# Patient Record
Sex: Female | Born: 1980 | Hispanic: No | Marital: Married | Smoking: Never smoker
Health system: Southern US, Community
[De-identification: ages and names within clinical notes are randomized; demographics above are authoritative.]

## PROBLEM LIST (undated history)

## (undated) DIAGNOSIS — F5001 Anorexia nervosa, restricting type: Secondary | ICD-10-CM

## (undated) DIAGNOSIS — F50019 Anorexia nervosa, restricting type, unspecified: Secondary | ICD-10-CM

## (undated) HISTORY — PX: ADENOIDECTOMY: SUR15

---

## 2001-01-02 ENCOUNTER — Other Ambulatory Visit: Admission: RE | Admit: 2001-01-02 | Discharge: 2001-01-02 | Payer: Self-pay | Admitting: Obstetrics and Gynecology

## 2004-04-01 ENCOUNTER — Other Ambulatory Visit: Admission: RE | Admit: 2004-04-01 | Discharge: 2004-04-01 | Payer: Self-pay | Admitting: Obstetrics and Gynecology

## 2008-03-19 ENCOUNTER — Encounter: Admission: RE | Admit: 2008-03-19 | Discharge: 2008-03-19 | Payer: Self-pay | Admitting: Obstetrics and Gynecology

## 2008-04-26 ENCOUNTER — Inpatient Hospital Stay (HOSPITAL_COMMUNITY): Admission: AD | Admit: 2008-04-26 | Discharge: 2008-04-27 | Payer: Self-pay | Admitting: Obstetrics and Gynecology

## 2008-04-29 ENCOUNTER — Encounter: Admission: RE | Admit: 2008-04-29 | Discharge: 2008-05-26 | Payer: Self-pay | Admitting: Obstetrics and Gynecology

## 2010-06-30 LAB — CBC
HCT: 24.8 % — ABNORMAL LOW (ref 36.0–46.0)
HCT: 34 % — ABNORMAL LOW (ref 36.0–46.0)
Hemoglobin: 11.4 g/dL — ABNORMAL LOW (ref 12.0–15.0)
Hemoglobin: 8.4 g/dL — ABNORMAL LOW (ref 12.0–15.0)
MCHC: 33.4 g/dL (ref 30.0–36.0)
MCHC: 34 g/dL (ref 30.0–36.0)
MCV: 91.3 fL (ref 78.0–100.0)
MCV: 92.7 fL (ref 78.0–100.0)
Platelets: 157 10*3/uL (ref 150–400)
Platelets: 178 10*3/uL (ref 150–400)
RBC: 2.67 MIL/uL — ABNORMAL LOW (ref 3.87–5.11)
RBC: 3.72 MIL/uL — ABNORMAL LOW (ref 3.87–5.11)
RDW: 12.9 % (ref 11.5–15.5)
RDW: 13 % (ref 11.5–15.5)
WBC: 14.6 10*3/uL — ABNORMAL HIGH (ref 4.0–10.5)
WBC: 9.4 10*3/uL (ref 4.0–10.5)

## 2010-06-30 LAB — GLUCOSE, CAPILLARY: Glucose-Capillary: 73 mg/dL (ref 70–99)

## 2010-06-30 LAB — RPR: RPR Ser Ql: NONREACTIVE

## 2010-07-28 NOTE — Discharge Summary (Signed)
[name], [name]               ACCOUNT NO.:  [id number]   MEDICAL RECORD NO.:  [id number]          PATIENT TYPE:  INP   LOCATION:  9132                          FACILITY:  [facility]   PHYSICIAN:  Malachi Pro. Ambrose Mantle, M.D. DATE OF BIRTH:  November 23, 1980   DATE OF ADMISSION:  04/26/2008  DATE OF DISCHARGE:  04/27/2008                               DISCHARGE SUMMARY   A 30 year old white female, para 0 gravida 1.  Estimated gestational age  [redacted] weeks by last period compatible with an 8-week ultrasound with EDC,  May 17, 2008, presented complaining of leaking fluid since 11:30 p.m.  on April 25, 2008.  She had had some contraction.  She was evaluated  in maternity admission.  Rupture of membranes was confirmed, cervix was  2 cm.  The patient was admitted and declined Pitocin and she walked for  a while.  Blood group and type O+ with a negative antibody, RPR  nonreactive, hepatitis B surface antigen negative.  Rubella equivocal.  HIV negative.  GC and Chlamydia negative.  One-hour Glucola was 149.  Three-hour GTT 78, 179, 15, and 141.  Group B strep was negative.  Prenatal care was complicated by shingles at 20 weeks.  Gestational  diabetes mellitus was controlled with diet.   PAST MEDICAL HISTORY:  No known drug allergies.  No history of  illnesses.  She had had her adenoids removed.  She was on no medication.   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  She conceived with  donor sperm.   PHYSICAL EXAMINATION:  On admission, she was afebrile with normal vital  signs.  Heart and lungs were normal.  The abdomen was soft, nontender.  Estimated fetal weight was felt to be about 7 pounds.  Fetal heart tones  were reassuring with moderate variability, some shallow variable  decelerations.   ADMITTING IMPRESSION:  Intrauterine pregnancy at 37 weeks with premature  rupture of the membranes.  Cervix was 3 cm, 80%.  The patient had  declined Pitocin.  Dr. Leighton Roach Meisinger  discussed the  risks of  infection with delaying delivery, and she agreed to start Pitocin.  The  patient then became uncomfortable with contractions.  She was working  with her birthing ball.  Cervix by 11:10 a.m. was 4 cm, 90%.  The  patient progressed to complete dilatation, pushed for 10 minutes, and  delivered a living female infant at 3:28 p.m. by Dr. Sherron Monday, 6  pounds 5 ounces infant.  Apgars of 9 at one and 9 at five minutes.  Placenta was expressed intact.  Second-degree perineal and peri-clitoral  lacerations, repaired with 3-0 Vicryl.  Blood loss about 500 mL.  Postpartum, the patient did well and requested discharge on the first  postpartum day.  RPR was nonreactive.  Capillary blood glucose was 73.  Initial hemoglobin 11.4, hematocrit 34, white count 9400, platelet count  178,000.  Followup hemoglobin 8.4, hematocrit 24.8.   FINAL DIAGNOSES:  Intrauterine pregnancy at 37 weeks with premature  rupture of membranes, delivered vertex.   OPERATION:  Spontaneous delivery vertex, repair of lacerations.  FINAL CONDITION:  Improved.  Instructions include the regular discharge  instruction booklet.  She declines analgesics but is advised to take  ferrous sulfate 325 mg twice daily along with her prenatal vitamin.      Malachi Pro. Ambrose Mantle, M.D.  Electronically Signed     TFH/MEDQ  D:  04/27/2008  T:  04/27/2008  Job:  [id number]

## 2019-02-26 ENCOUNTER — Telehealth: Payer: Self-pay | Admitting: *Deleted

## 2019-02-26 NOTE — Telephone Encounter (Signed)
Erroneous encounter.   Encounter closed.  

## 2019-05-10 ENCOUNTER — Ambulatory Visit: Payer: Self-pay

## 2020-07-18 ENCOUNTER — Emergency Department (HOSPITAL_BASED_OUTPATIENT_CLINIC_OR_DEPARTMENT_OTHER)
Admission: EM | Admit: 2020-07-18 | Discharge: 2020-07-18 | Disposition: A | Discharge status: Home / Self Care | Payer: BC Managed Care – PPO | Attending: Emergency Medicine | Admitting: Emergency Medicine

## 2020-07-18 ENCOUNTER — Other Ambulatory Visit: Payer: Self-pay

## 2020-07-18 ENCOUNTER — Encounter (HOSPITAL_BASED_OUTPATIENT_CLINIC_OR_DEPARTMENT_OTHER): Payer: Self-pay | Admitting: Obstetrics and Gynecology

## 2020-07-18 DIAGNOSIS — R5383 Other fatigue: Secondary | ICD-10-CM | POA: Diagnosis not present

## 2020-07-18 DIAGNOSIS — E860 Dehydration: Secondary | ICD-10-CM | POA: Diagnosis not present

## 2020-07-18 DIAGNOSIS — R531 Weakness: Secondary | ICD-10-CM | POA: Diagnosis not present

## 2020-07-18 DIAGNOSIS — R630 Anorexia: Secondary | ICD-10-CM | POA: Insufficient documentation

## 2020-07-18 DIAGNOSIS — F419 Anxiety disorder, unspecified: Secondary | ICD-10-CM | POA: Insufficient documentation

## 2020-07-18 DIAGNOSIS — R110 Nausea: Secondary | ICD-10-CM | POA: Diagnosis not present

## 2020-07-18 HISTORY — DX: Anorexia nervosa, restricting type, unspecified: F50.019

## 2020-07-18 HISTORY — DX: Anorexia nervosa, restricting type: F50.01

## 2020-07-18 LAB — URINALYSIS, ROUTINE W REFLEX MICROSCOPIC
Bilirubin Urine: NEGATIVE
Glucose, UA: NEGATIVE mg/dL
Hgb urine dipstick: NEGATIVE
Ketones, ur: >80 mg/dL — AB
Leukocytes,Ua: NEGATIVE
Nitrite: NEGATIVE
Protein, ur: NEGATIVE mg/dL
Specific Gravity, Urine: 1.015 (ref 1.005–1.030)
pH: 5 (ref 5.0–8.0)

## 2020-07-18 LAB — COMPREHENSIVE METABOLIC PANEL
ALT: 14 U/L (ref 0–44)
AST: 22 U/L (ref 15–41)
Albumin: 4.9 g/dL (ref 3.5–5.0)
Alkaline Phosphatase: 49 U/L (ref 38–126)
Anion gap: 15 (ref 5–15)
BUN: 8 mg/dL (ref 6–20)
CO2: 20 mmol/L — ABNORMAL LOW (ref 22–32)
Calcium: 9.4 mg/dL (ref 8.9–10.3)
Chloride: 99 mmol/L (ref 98–111)
Creatinine, Ser: 0.73 mg/dL (ref 0.44–1.00)
GFR, Estimated: >60 mL/min (ref >60)
Glucose, Bld: 143 mg/dL — ABNORMAL HIGH (ref 70–99)
Potassium: 4.3 mmol/L (ref 3.5–5.1)
Sodium: 134 mmol/L — ABNORMAL LOW (ref 135–145)
Total Bilirubin: 0.6 mg/dL (ref 0.3–1.2)
Total Protein: 8 g/dL (ref 6.5–8.1)

## 2020-07-18 LAB — CBC WITH DIFFERENTIAL/PLATELET
Abs Immature Granulocytes: 0.01 10*3/uL (ref 0.00–0.07)
Basophils Absolute: 0.1 10*3/uL (ref 0.0–0.1)
Basophils Relative: 1 %
Eosinophils Absolute: 0.1 10*3/uL (ref 0.0–0.5)
Eosinophils Relative: 1 %
HCT: 40.6 % (ref 36.0–46.0)
Hemoglobin: 13.7 g/dL (ref 12.0–15.0)
Immature Granulocytes: 0 %
Lymphocytes Relative: 34 %
Lymphs Abs: 2.1 10*3/uL (ref 0.7–4.0)
MCH: 30.9 pg (ref 26.0–34.0)
MCHC: 33.7 g/dL (ref 30.0–36.0)
MCV: 91.4 fL (ref 80.0–100.0)
Monocytes Absolute: 0.8 10*3/uL (ref 0.1–1.0)
Monocytes Relative: 12 %
Neutro Abs: 3.3 10*3/uL (ref 1.7–7.7)
Neutrophils Relative %: 52 %
Platelets: 225 10*3/uL (ref 150–400)
RBC: 4.44 MIL/uL (ref 3.87–5.11)
RDW: 12.2 % (ref 11.5–15.5)
WBC: 6.3 10*3/uL (ref 4.0–10.5)
nRBC: 0 % (ref 0.0–0.2)

## 2020-07-18 MED ORDER — SODIUM CHLORIDE 0.9 % IV BOLUS
1000.0000 mL | Freq: Once | INTRAVENOUS | Status: AC
  Administered 2020-07-18: 1000 mL via INTRAVENOUS

## 2020-07-18 NOTE — BH Assessment (Signed)
Received TTS consult order. All TTS counselors are currently with walk-in patients at [facility] and [facility]. There will be a delay in Pt being assessed.   Pamalee Leyden, [facility], [facility] Triage Specialist [phone]

## 2020-07-18 NOTE — ED Provider Notes (Signed)
[facility] EMERGENCY DEPT Provider Note   CSN: [id number] Arrival date & time: 07/18/20  1739     History Chief Complaint  Patient presents with  . Anorexia    [name] is a 40 y.o. female.  Patient is a 40 year old female who presents with lightheadedness and weakness from anorexia.  She says she had a eating disorder issue in 2020 and 21.  She says it got better but over the last week she has had some increased stressors at work and its flared up again.  She says she has not eaten much all week.  She has not consumed any food in the last 3 days.  She says she has been trying to hydrate but she has had some nausea so has she has not had much to drink today.  She is followed by a group that treats her eating disorder.  She was supposed to see a therapist and a dietitian today but the dietitian was concerned about her lack of eating and sent her here for evaluation.  She has had some lightheadedness and general fatigue.  She does not report any chest pain or shortness of breath but does have some palpitations.  No diarrhea.  She has had some intermittent dry heaves.  No fevers, cough, congestion or other recent illnesses.  She denies any abdominal pain.  She has some anxiety and some mild depression but she denies any suicidal ideations.          Past Medical History:  Diagnosis Date  . Anorexia nervosa, restricting type     There are no problems to display for this patient.   Past Surgical History:  Procedure Laterality Date  . ADENOIDECTOMY       OB History    Gravida      Para      Term      Preterm      AB      Living  1     SAB      IAB      Ectopic      Multiple      Live Births              No family history on file.  Social History   Tobacco Use  . Smoking status: Never Smoker  . Smokeless tobacco: Never Used  Vaping Use  . Vaping Use: Never used  Substance Use Topics  . Alcohol use: Yes    Comment: Social  .  Drug use: Not Currently    Home Medications Prior to Admission medications   Not on File    Allergies    Sulfa antibiotics  Review of Systems   Review of Systems  Constitutional: Positive for fatigue. Negative for chills, diaphoresis and fever.  HENT: Negative for congestion, rhinorrhea and sneezing.   Eyes: Negative.   Respiratory: Negative for cough, chest tightness and shortness of breath.   Cardiovascular: Positive for palpitations. Negative for chest pain and leg swelling.  Gastrointestinal: Positive for nausea. Negative for abdominal pain, blood in stool, diarrhea and vomiting.  Genitourinary: Negative for difficulty urinating, flank pain, frequency and hematuria.  Musculoskeletal: Negative for arthralgias and back pain.  Skin: Negative for rash.  Neurological: Positive for light-headedness. Negative for dizziness, speech difficulty, weakness, numbness and headaches.  Psychiatric/Behavioral: Positive for dysphoric mood. Negative for suicidal ideas. The patient is nervous/anxious.     Physical Exam Updated Vital Signs BP 127/78 (BP Location: Right Arm)   Pulse 96  Temp 98.4 F (36.9 C) (Oral)   Resp 16   Ht 5\' 5"  (1.651 m)   Wt 62.2 kg   LMP 07/06/2020 (Approximate)   SpO2 99%   BMI 22.83 kg/m   Physical Exam Constitutional:      Appearance: She is well-developed.  HENT:     Head: Normocephalic and atraumatic.  Eyes:     Pupils: Pupils are equal, round, and reactive to light.  Cardiovascular:     Rate and Rhythm: Normal rate and regular rhythm.     Heart sounds: Normal heart sounds.  Pulmonary:     Effort: Pulmonary effort is normal. No respiratory distress.     Breath sounds: Normal breath sounds. No wheezing or rales.  Chest:     Chest wall: No tenderness.  Abdominal:     General: Bowel sounds are normal.     Palpations: Abdomen is soft.     Tenderness: There is no abdominal tenderness. There is no guarding or rebound.  Musculoskeletal:         General: Normal range of motion.     Cervical back: Normal range of motion and neck supple.  Lymphadenopathy:     Cervical: No cervical adenopathy.  Skin:    General: Skin is warm and dry.     Findings: No rash.  Neurological:     Mental Status: She is alert and oriented to person, place, and time.     ED Results / Procedures / Treatments   Labs (all labs ordered are listed, but only abnormal results are displayed) Labs Reviewed  COMPREHENSIVE METABOLIC PANEL - Abnormal; Notable for the following components:      Result Value   Sodium 134 (*)    CO2 20 (*)    Glucose, Bld 143 (*)    All other components within normal limits  URINALYSIS, ROUTINE W REFLEX MICROSCOPIC - Abnormal; Notable for the following components:   Ketones, ur >80 (*)    All other components within normal limits  CBC WITH DIFFERENTIAL/PLATELET    EKG None  Radiology No results found.  Procedures Procedures   Medications Ordered in ED Medications  sodium chloride 0.9 % bolus 1,000 mL (0 mLs Intravenous Stopped 07/18/20 1921)    ED Course  I have reviewed the triage vital signs and the nursing notes.  Pertinent labs & imaging results that were available during my care of the patient were reviewed by me and considered in my medical decision making (see chart for details).    MDM Rules/Calculators/A&P                          Patient presents with concerns for dehydration and malnutrition from not eating for the last few days.  She does have some anxiety but denies any thoughts of hurting herself.  Her labs show mildly elevated glucose.  She has some ketones in her urine.  Otherwise her electrolytes look okay and her renal function is normal.  She was given IV fluids.  She was able to drink Shasta cola and eat some crackers without difficulty.  I did order TTS consult.  However patient waited for couple hours and then did not want to wait any longer.  She does not meet criteria for IVC.  She does say  that she will try to stay hydrated and eat at home.  She is texting her dietitian when she leaves here and says that she can follow-up closely with her therapist.  Return precautions were given. Final Clinical Impression(s) / ED Diagnoses Final diagnoses:  Dehydration  Anorexia    Rx / DC Orders ED Discharge Orders    None       Rolan Bucco, MD 07/18/20 2134

## 2020-07-18 NOTE — ED Triage Notes (Signed)
Patient reports to the ER for anorexia with possible dehydration/malnutrition. Patient was sent by her dietician after an office visit with a reported 5% weight loss.

## 2020-07-18 NOTE — ED Notes (Signed)
Patient given crackers and lemon lime soda at this time.

## 2020-07-18 NOTE — ED Notes (Signed)
Called Ford at [facility] to advise of TTS

## 2020-07-26 ENCOUNTER — Observation Stay (HOSPITAL_COMMUNITY): Payer: BC Managed Care – PPO

## 2020-07-26 ENCOUNTER — Inpatient Hospital Stay (HOSPITAL_COMMUNITY)
Admission: AD | Admit: 2020-07-26 | Discharge: 2020-07-29 | DRG: 640 | Disposition: A | Discharge status: Home / Self Care | Payer: BC Managed Care – PPO | Source: Ambulatory Visit | Attending: Internal Medicine | Admitting: Internal Medicine

## 2020-07-26 ENCOUNTER — Other Ambulatory Visit: Payer: Self-pay

## 2020-07-26 ENCOUNTER — Encounter (HOSPITAL_COMMUNITY): Payer: Self-pay | Admitting: Internal Medicine

## 2020-07-26 DIAGNOSIS — R002 Palpitations: Secondary | ICD-10-CM | POA: Diagnosis not present

## 2020-07-26 DIAGNOSIS — F5000 Anorexia nervosa, unspecified: Secondary | ICD-10-CM

## 2020-07-26 DIAGNOSIS — Z882 Allergy status to sulfonamides status: Secondary | ICD-10-CM

## 2020-07-26 DIAGNOSIS — F419 Anxiety disorder, unspecified: Secondary | ICD-10-CM | POA: Diagnosis present

## 2020-07-26 DIAGNOSIS — E8729 Other acidosis: Secondary | ICD-10-CM | POA: Diagnosis present

## 2020-07-26 DIAGNOSIS — E860 Dehydration: Principal | ICD-10-CM | POA: Diagnosis present

## 2020-07-26 DIAGNOSIS — Z20822 Contact with and (suspected) exposure to covid-19: Secondary | ICD-10-CM | POA: Diagnosis present

## 2020-07-26 DIAGNOSIS — R000 Tachycardia, unspecified: Secondary | ICD-10-CM | POA: Diagnosis not present

## 2020-07-26 DIAGNOSIS — R630 Anorexia: Secondary | ICD-10-CM

## 2020-07-26 DIAGNOSIS — E161 Other hypoglycemia: Secondary | ICD-10-CM | POA: Diagnosis present

## 2020-07-26 DIAGNOSIS — F50019 Anorexia nervosa, restricting type, unspecified: Secondary | ICD-10-CM

## 2020-07-26 DIAGNOSIS — Z6821 Body mass index (BMI) 21.0-21.9, adult: Secondary | ICD-10-CM

## 2020-07-26 DIAGNOSIS — F5001 Anorexia nervosa, restricting type: Secondary | ICD-10-CM | POA: Diagnosis present

## 2020-07-26 DIAGNOSIS — Z809 Family history of malignant neoplasm, unspecified: Secondary | ICD-10-CM

## 2020-07-26 DIAGNOSIS — E43 Unspecified severe protein-calorie malnutrition: Secondary | ICD-10-CM | POA: Insufficient documentation

## 2020-07-26 DIAGNOSIS — E872 Acidosis: Secondary | ICD-10-CM | POA: Diagnosis present

## 2020-07-26 DIAGNOSIS — E876 Hypokalemia: Secondary | ICD-10-CM | POA: Diagnosis present

## 2020-07-26 DIAGNOSIS — Z8659 Personal history of other mental and behavioral disorders: Secondary | ICD-10-CM

## 2020-07-26 DIAGNOSIS — T730XXA Starvation, initial encounter: Secondary | ICD-10-CM | POA: Diagnosis present

## 2020-07-26 LAB — CBC WITH DIFFERENTIAL/PLATELET
Abs Immature Granulocytes: 0.02 10*3/uL (ref 0.00–0.07)
Basophils Absolute: 0.1 10*3/uL (ref 0.0–0.1)
Basophils Relative: 1 %
Eosinophils Absolute: 0.1 10*3/uL (ref 0.0–0.5)
Eosinophils Relative: 2 %
HCT: 43.9 % (ref 36.0–46.0)
Hemoglobin: 15 g/dL (ref 12.0–15.0)
Immature Granulocytes: 0 %
Lymphocytes Relative: 35 %
Lymphs Abs: 2.6 10*3/uL (ref 0.7–4.0)
MCH: 31.6 pg (ref 26.0–34.0)
MCHC: 34.2 g/dL (ref 30.0–36.0)
MCV: 92.6 fL (ref 80.0–100.0)
Monocytes Absolute: 0.8 10*3/uL (ref 0.1–1.0)
Monocytes Relative: 10 %
Neutro Abs: 3.8 10*3/uL (ref 1.7–7.7)
Neutrophils Relative %: 52 %
Platelets: 189 10*3/uL (ref 150–400)
RBC: 4.74 MIL/uL (ref 3.87–5.11)
RDW: 12.3 % (ref 11.5–15.5)
WBC: 7.3 10*3/uL (ref 4.0–10.5)
nRBC: 0 % (ref 0.0–0.2)

## 2020-07-26 LAB — COMPREHENSIVE METABOLIC PANEL
ALT: 15 U/L (ref 0–44)
AST: 28 U/L (ref 15–41)
Albumin: 4.6 g/dL (ref 3.5–5.0)
Alkaline Phosphatase: 45 U/L (ref 38–126)
Anion gap: 16 — ABNORMAL HIGH (ref 5–15)
BUN: 15 mg/dL (ref 6–20)
CO2: 12 mmol/L — ABNORMAL LOW (ref 22–32)
Calcium: 9 mg/dL (ref 8.9–10.3)
Chloride: 109 mmol/L (ref 98–111)
Creatinine, Ser: 0.78 mg/dL (ref 0.44–1.00)
GFR, Estimated: >60 mL/min (ref >60)
Glucose, Bld: 61 mg/dL — ABNORMAL LOW (ref 70–99)
Potassium: 4.6 mmol/L (ref 3.5–5.1)
Sodium: 137 mmol/L (ref 135–145)
Total Bilirubin: 1.5 mg/dL — ABNORMAL HIGH (ref 0.3–1.2)
Total Protein: 7.9 g/dL (ref 6.5–8.1)

## 2020-07-26 LAB — GLUCOSE, CAPILLARY
Glucose-Capillary: 59 mg/dL — ABNORMAL LOW (ref 70–99)
Glucose-Capillary: 106 mg/dL — ABNORMAL HIGH (ref 70–99)
Glucose-Capillary: 142 mg/dL — ABNORMAL HIGH (ref 70–99)

## 2020-07-26 LAB — PHOSPHORUS: Phosphorus: 3.2 mg/dL (ref 2.5–4.6)

## 2020-07-26 LAB — URINALYSIS, ROUTINE W REFLEX MICROSCOPIC
Bacteria, UA: NONE SEEN
Bilirubin Urine: NEGATIVE
Glucose, UA: NEGATIVE mg/dL
Hgb urine dipstick: NEGATIVE
Ketones, ur: 80 mg/dL — AB
Leukocytes,Ua: NEGATIVE
Nitrite: NEGATIVE
Protein, ur: 100 mg/dL — AB
Specific Gravity, Urine: 1.027 (ref 1.005–1.030)
pH: 6 (ref 5.0–8.0)

## 2020-07-26 LAB — SARS CORONAVIRUS 2 (TAT 6-24 HRS): SARS Coronavirus 2: NEGATIVE

## 2020-07-26 LAB — TSH: TSH: 0.425 u[IU]/mL (ref 0.350–4.500)

## 2020-07-26 LAB — MAGNESIUM: Magnesium: 2 mg/dL (ref 1.7–2.4)

## 2020-07-26 LAB — CORTISOL: Cortisol, Plasma: 13.4 ug/dL

## 2020-07-26 LAB — PROTIME-INR
INR: 1.1 (ref 0.8–1.2)
Prothrombin Time: 13.9 seconds (ref 11.4–15.2)

## 2020-07-26 LAB — LACTIC ACID, PLASMA: Lactic Acid, Venous: 1 mmol/L (ref 0.5–1.9)

## 2020-07-26 MED ORDER — ACETAMINOPHEN 325 MG PO TABS: 650.0000 mg | ORAL_TABLET | Freq: Four times a day (QID) | ORAL | Status: DC | PRN

## 2020-07-26 MED ORDER — ESCITALOPRAM OXALATE 10 MG PO TABS
20.0000 mg | ORAL_TABLET | Freq: Every day | ORAL | Status: DC
  Administered 2020-07-26 – 2020-07-28 (×3): 20 mg via ORAL
  Filled 2020-07-26 (×3): qty 2

## 2020-07-26 MED ORDER — ONDANSETRON HCL 4 MG/2ML IJ SOLN: 4.0000 mg | Freq: Four times a day (QID) | INTRAMUSCULAR | Status: DC | PRN

## 2020-07-26 MED ORDER — DEXTROSE-NACL 5-0.9 % IV SOLN: INTRAVENOUS | Status: DC

## 2020-07-26 MED ORDER — BUSPIRONE HCL 5 MG PO TABS: 5.0000 mg | ORAL_TABLET | Freq: Three times a day (TID) | ORAL | Status: DC | PRN

## 2020-07-26 MED ORDER — SODIUM CHLORIDE 0.9 % IV BOLUS
1000.0000 mL | Freq: Once | INTRAVENOUS | Status: AC
  Administered 2020-07-26: 1000 mL via INTRAVENOUS

## 2020-07-26 MED ORDER — ONDANSETRON HCL 4 MG PO TABS: 4.0000 mg | ORAL_TABLET | Freq: Four times a day (QID) | ORAL | Status: DC | PRN

## 2020-07-26 MED ORDER — ACETAMINOPHEN 650 MG RE SUPP: 650.0000 mg | Freq: Four times a day (QID) | RECTAL | Status: DC | PRN

## 2020-07-26 MED ORDER — DOCUSATE SODIUM 100 MG PO CAPS
100.0000 mg | ORAL_CAPSULE | Freq: Two times a day (BID) | ORAL | Status: DC
  Administered 2020-07-26 – 2020-07-29 (×5): 100 mg via ORAL
  Filled 2020-07-26 (×6): qty 1

## 2020-07-26 NOTE — Progress Notes (Signed)
Hypoglycemic Event  CBG: 59  Treatment:4 oz orange juice, Graham Crackers, Peanut butter Symptoms: None Follow-up CBG: Time:1738 CBG Result:106 Possible Reasons for Event: Last meal on 07/20/20 Comments/MD notified: Yes and  Received new orders   Sequoia Witz  Maryjean Ka

## 2020-07-26 NOTE — H&P (Addendum)
History and Physical    [name] DVV:[id number] DOB: 1980-09-11 DOA: 07/26/2020  PCP: Pcp, No Patient coming from: home  Chief Complaint: decreased oral intake and anorexia   HPI: [name] is a 40 y.o. female with medical history significant of anorexia nervosa who presented with decreased oral intake and anorexia.  Patient reports that she has had anorexia nervosa for almost " my whole life".  She lives at home with her 83 year old daughter and 2 cats.  She is followed by her PCP, dietitian and psychiatrist as outpatient.  Patient reports that she has been in the "downside of anorexia" over last 2 weeks.  She has not been able to eat and drink much.  Her last meal was an egg on Sunday 07/20/2020.  She did eat some vegetables on that day but she had induced vomiting afterwards.  She reports decreased fluid intake as well.  Associate symptoms include occasional palpitations feeling.  Patient was evaluated for dehydration at ED on 07/18/2020 and work-up showed no significant electrolyte abnormalities at that time. She She has labs draws at [facility] facilities yesterday on 07/25/20. The labs results could be found on care everywhere.  She had unremarkable CBC.  CMP showed sodium 141, chloride 104, potassium 4.3, BUN 13, creatinine 0.79, CO2 11, calcium 9.8, total protein 8.1, albumin 5.1, total bilirubin 0.6, ALP 60, AST 21, ALT 13, blood glucose 67, UA showed ketone and protein.  Patient states that she talked to her psychiatrist and family physician yesterday, and was instructed to come to the hospital. She is direct admit from home today.  Patient reports that she has had COVID-vaccine x3 shots.  Denies fevers, chills, cough, wheezing, shortness of breath, chest pain, chest pressure, diarrhea, abdominal pain, dysuria, urinary frequency or urgency.   Review of Systems: As per HPI otherwise 10 point review of systems negative.  Review of Systems Otherwise negative except as per HPI,  including: General: Denies fever, chills, night sweats or unintended weight loss. Resp: Denies cough, wheezing, shortness of breath. Cardiac: Denies chest pain, palpitations, orthopnea, paroxysmal nocturnal dyspnea. GI: Denies abdominal pain, nausea, vomiting, diarrhea or constipation.  Positive for decreased oral intake and anorexia. GU: Denies dysuria, frequency, hesitancy or incontinence MS: Denies muscle aches, joint pain or swelling Neuro: Denies headache, neurologic deficits (focal weakness, numbness, tingling), abnormal gait Psych: Denies anxiety, depression, SI/HI/AVH Skin: Denies new rashes or lesions ID: Denies sick contacts, exotic exposures, travel  Past Medical History:  Diagnosis Date  . Anorexia nervosa, restricting type     Past Surgical History:  Procedure Laterality Date  . ADENOIDECTOMY      SOCIAL HISTORY:  reports that she has never smoked. She has never used smokeless tobacco. She reports current alcohol use. She reports previous drug use.  Allergies  Allergen Reactions  . Sulfamethoxazole Hives  . Sulfa Antibiotics Hives    FAMILY HISTORY: Family History  Problem Relation Age of Onset  . Cancer Father      Prior to Admission medications   Medication Sig Start Date End Date Taking? Authorizing Provider  busPIRone (BUSPAR) 5 MG tablet Take 5 mg by mouth 3 (three) times daily as needed (for anxiety). 06/25/20  Yes [provider]  escitalopram (LEXAPRO) 20 MG tablet Take 20 mg by mouth at bedtime.   Yes [provider]  escitalopram (LEXAPRO) 10 MG tablet Take 10 mg by mouth daily. 03/07/20   [provider]    Physical Exam: Vitals:   07/26/20 1136  07/26/20 1355  BP: 108/75 116/72  Pulse: 96 90  Resp: 16 18  Temp: 99.5 F (37.5 C) 98.9 F (37.2 C)  TempSrc: Oral Oral  SpO2: 96% 100%  Weight: 57.4 kg   Height: 5\' 5"  (1.651 m)       Constitutional: NAD, calm, comfortable Eyes: PERRL, lids and conjunctivae  normal ENMT: Mucous membranes are moist. Posterior pharynx clear of any exudate or lesions.Normal dentition.  Neck: normal, supple, no masses, no thyromegaly Respiratory: clear to auscultation bilaterally, no wheezing, no crackles. Normal respiratory effort. No accessory muscle use.  Cardiovascular: Regular rate and rhythm, no murmurs / rubs / gallops. No extremity edema. 2+ pedal pulses. No carotid bruits.  Abdomen: no tenderness, no masses palpated. No hepatosplenomegaly. Bowel sounds positive.  Musculoskeletal: no clubbing / cyanosis. No joint deformity upper and lower extremities. Good ROM, no contractures. Normal muscle tone.  Skin: no rashes, lesions, ulcers. No induration Neurologic: CN 2-12 grossly intact. Sensation intact, DTR normal. Strength 5/5 in all 4.  Psychiatric: Normal judgment and insight. Alert and oriented x 3. Normal mood.     Labs on Admission: I have personally reviewed following labs and imaging studies  CBC: No results for input(s): WBC, NEUTROABS, HGB, HCT, MCV, PLT in the last 168 hours. Basic Metabolic Panel: No results for input(s): Rita Prom, K, CL, CO2, GLUCOSE, BUN, CREATININE, CALCIUM, MG, PHOS in the last 168 hours. GFR: Estimated Creatinine Clearance: 85 mL/min (by C-G formula based on SCr of 0.73 mg/dL). Liver Function Tests: No results for input(s): AST, ALT, ALKPHOS, BILITOT, PROT, ALBUMIN in the last 168 hours. No results for input(s): LIPASE, AMYLASE in the last 168 hours. No results for input(s): AMMONIA in the last 168 hours. Coagulation Profile: No results for input(s): INR, PROTIME in the last 168 hours. Cardiac Enzymes: No results for input(s): CKTOTAL, CKMB, CKMBINDEX, TROPONINI in the last 168 hours. BNP (last 3 results) No results for input(s): PROBNP in the last 8760 hours. HbA1C: No results for input(s): HGBA1C in the last 72 hours. CBG: No results for input(s): GLUCAP in the last 168 hours. Lipid Profile: No results for input(s): CHOL,  HDL, LDLCALC, TRIG, CHOLHDL, LDLDIRECT in the last 72 hours. Thyroid Function Tests: No results for input(s): TSH, T4TOTAL, FREET4, T3FREE, THYROIDAB in the last 72 hours. Anemia Panel: No results for input(s): VITAMINB12, FOLATE, FERRITIN, TIBC, IRON, RETICCTPCT in the last 72 hours. Urine analysis:    Component Value Date/Time   COLORURINE YELLOW 07/18/2020 1856   APPEARANCEUR CLEAR 07/18/2020 1856   LABSPEC 1.015 07/18/2020 1856   PHURINE 5.0 07/18/2020 1856   GLUCOSEU NEGATIVE 07/18/2020 1856   HGBUR NEGATIVE 07/18/2020 1856   BILIRUBINUR NEGATIVE 07/18/2020 1856   KETONESUR >80 (A) 07/18/2020 1856   PROTEINUR NEGATIVE 07/18/2020 1856   NITRITE NEGATIVE 07/18/2020 1856   LEUKOCYTESUR NEGATIVE 07/18/2020 1856   Sepsis Labs: !!!!!!!!!!!!!!!!!!!!!!!!!!!!!!!!!!!!!!!!!!!! @LABRCNTIP (procalcitonin:4,lacticidven:4) )No results found for this or any previous visit (from the past 240 hour(s)).   Radiological Exams on Admission: No results found.   All images have been reviewed by me personally.  EKG: Independently reviewed.   Assessment/Plan Active Problems:   Anorexia   Anorexia nervosa   Palpitation   Sinus tachycardia   Assessment  plan  #Decreased oral intake and anorexia #History of anorexia nervosa   Patient has a long history of anorexia nervosa, and she reports exacerbation of her anorexia x 2 weeks. She had lab draws yesterday on 5/13 and no significant electrolyte imbalance noted except for low CO2  of 11.    -Admit to observation status -Regular diet -We will start IV fluids pending CMP results -Dietitian and a psychiatric consult placed -All admission labs and x-ray are pending and will follow up - she denies SI/HI.   #Palpitations #Sinus tachycardia -EKG showed sinus tachycardia which is likely related to dehydration -We will start IV fluids           Body mass index is 21.07 kg/m.    DVT prophylaxis: Lovenox Code Status: Full code Family  Communication: None at bedside Consults called: Psychiatry and dietitian Admission status: Observation  Status is: Observation  The patient remains OBS appropriate and will d/c before 2 midnights.  Dispo: The patient is from: Home              Anticipated d/c is to: Home              Patient currently is not medically stable to d/c.   Difficult to place patient No       Time Spent: 65 minutes.  >50% of the time was devoted to discussing the patients care, assessment, plan and disposition with other care givers along with counseling the patient about the risks and benefits of treatment.    Dede Query MD [facility]  If 7PM-7AM, please contact night-coverage   07/26/2020, 4:09 PM

## 2020-07-26 NOTE — Progress Notes (Signed)
   07/26/20 1136  Assess: MEWS Score  Temp 99.5 F (37.5 C)  BP 108/75  Pulse Rate 96  ECG Heart Rate (!) 129  Resp 16  Level of Consciousness Alert  SpO2 96 %  O2 Device Room Air  Assess: MEWS Score  MEWS Temp 0  MEWS Systolic 0  MEWS Pulse 2  MEWS RR 0  MEWS LOC 0  MEWS Score 2  MEWS Score Color Yellow  Assess: if the MEWS score is Yellow or Red  Were vital signs taken at a resting state? Yes  Focused Assessment No change from prior assessment  Early Detection of Sepsis Score *See Row Information* Low  MEWS guidelines implemented *See Row Information* Yes  Treat  Pain Scale 0-10  Pain Score 3  Pain Type Acute pain  Pain Location Chest  Pain Orientation Mid;Lower;Left  Pain Radiating Towards Localized  Pain Descriptors / Indicators Discomfort  Notify: Charge Nurse/RN  Name of Charge Nurse/RN Notified Jessica, RN  Date Charge Nurse/RN Notified 07/26/20  Time Charge Nurse/RN Notified 1200  Notify: Provider  Provider Name/Title Dede Query  Date Provider Notified 07/26/20  Time Provider Notified 1330  Notification Type  (secure chat)  Notification Reason Change in status (MEWS Yellow)  Provider response See new orders  Date of Provider Response 07/26/20  Time of Provider Response 1335  Document  Patient Outcome Other (Comment) (Observing)  Progress note created (see row info) Yes

## 2020-07-27 DIAGNOSIS — F419 Anxiety disorder, unspecified: Secondary | ICD-10-CM | POA: Diagnosis present

## 2020-07-27 DIAGNOSIS — E876 Hypokalemia: Secondary | ICD-10-CM | POA: Diagnosis present

## 2020-07-27 DIAGNOSIS — Z8659 Personal history of other mental and behavioral disorders: Secondary | ICD-10-CM | POA: Diagnosis not present

## 2020-07-27 DIAGNOSIS — E161 Other hypoglycemia: Secondary | ICD-10-CM

## 2020-07-27 DIAGNOSIS — Z809 Family history of malignant neoplasm, unspecified: Secondary | ICD-10-CM | POA: Diagnosis not present

## 2020-07-27 DIAGNOSIS — R002 Palpitations: Secondary | ICD-10-CM | POA: Diagnosis not present

## 2020-07-27 DIAGNOSIS — E872 Acidosis: Secondary | ICD-10-CM | POA: Diagnosis present

## 2020-07-27 DIAGNOSIS — E860 Dehydration: Secondary | ICD-10-CM | POA: Diagnosis present

## 2020-07-27 DIAGNOSIS — Z6821 Body mass index (BMI) 21.0-21.9, adult: Secondary | ICD-10-CM | POA: Diagnosis not present

## 2020-07-27 DIAGNOSIS — E43 Unspecified severe protein-calorie malnutrition: Secondary | ICD-10-CM | POA: Diagnosis present

## 2020-07-27 DIAGNOSIS — Z882 Allergy status to sulfonamides status: Secondary | ICD-10-CM | POA: Diagnosis not present

## 2020-07-27 DIAGNOSIS — E8729 Other acidosis: Secondary | ICD-10-CM | POA: Diagnosis present

## 2020-07-27 DIAGNOSIS — F5001 Anorexia nervosa, restricting type: Secondary | ICD-10-CM

## 2020-07-27 DIAGNOSIS — R000 Tachycardia, unspecified: Secondary | ICD-10-CM | POA: Diagnosis not present

## 2020-07-27 DIAGNOSIS — Z20822 Contact with and (suspected) exposure to covid-19: Secondary | ICD-10-CM | POA: Diagnosis present

## 2020-07-27 LAB — COMPREHENSIVE METABOLIC PANEL
ALT: 14 U/L (ref 0–44)
AST: 18 U/L (ref 15–41)
Albumin: 3.6 g/dL (ref 3.5–5.0)
Alkaline Phosphatase: 43 U/L (ref 38–126)
Anion gap: 6 (ref 5–15)
BUN: 9 mg/dL (ref 6–20)
CO2: 20 mmol/L — ABNORMAL LOW (ref 22–32)
Calcium: 8.5 mg/dL — ABNORMAL LOW (ref 8.9–10.3)
Chloride: 110 mmol/L (ref 98–111)
Creatinine, Ser: 0.67 mg/dL (ref 0.44–1.00)
GFR, Estimated: >60 mL/min (ref >60)
Glucose, Bld: 111 mg/dL — ABNORMAL HIGH (ref 70–99)
Potassium: 3.4 mmol/L — ABNORMAL LOW (ref 3.5–5.1)
Sodium: 136 mmol/L (ref 135–145)
Total Bilirubin: 1 mg/dL (ref 0.3–1.2)
Total Protein: 6.4 g/dL — ABNORMAL LOW (ref 6.5–8.1)

## 2020-07-27 LAB — CBC WITH DIFFERENTIAL/PLATELET
Abs Immature Granulocytes: 0.01 10*3/uL (ref 0.00–0.07)
Basophils Absolute: 0.1 10*3/uL (ref 0.0–0.1)
Basophils Relative: 1 %
Eosinophils Absolute: 0.3 10*3/uL (ref 0.0–0.5)
Eosinophils Relative: 4 %
HCT: 37.1 % (ref 36.0–46.0)
Hemoglobin: 12.9 g/dL (ref 12.0–15.0)
Immature Granulocytes: 0 %
Lymphocytes Relative: 45 %
Lymphs Abs: 2.8 10*3/uL (ref 0.7–4.0)
MCH: 31.1 pg (ref 26.0–34.0)
MCHC: 34.8 g/dL (ref 30.0–36.0)
MCV: 89.4 fL (ref 80.0–100.0)
Monocytes Absolute: 0.9 10*3/uL (ref 0.1–1.0)
Monocytes Relative: 13 %
Neutro Abs: 2.4 10*3/uL (ref 1.7–7.7)
Neutrophils Relative %: 37 %
Platelets: 182 10*3/uL (ref 150–400)
RBC: 4.15 MIL/uL (ref 3.87–5.11)
RDW: 12 % (ref 11.5–15.5)
WBC: 6.4 10*3/uL (ref 4.0–10.5)
nRBC: 0 % (ref 0.0–0.2)

## 2020-07-27 LAB — GLUCOSE, CAPILLARY
Glucose-Capillary: 111 mg/dL — ABNORMAL HIGH (ref 70–99)
Glucose-Capillary: 101 mg/dL — ABNORMAL HIGH (ref 70–99)
Glucose-Capillary: 95 mg/dL (ref 70–99)
Glucose-Capillary: 98 mg/dL (ref 70–99)

## 2020-07-27 LAB — MAGNESIUM: Magnesium: 1.7 mg/dL (ref 1.7–2.4)

## 2020-07-27 MED ORDER — LACTATED RINGERS IV SOLN: INTRAVENOUS | Status: DC

## 2020-07-27 MED ORDER — CYPROHEPTADINE HCL 4 MG PO TABS
4.0000 mg | ORAL_TABLET | Freq: Two times a day (BID) | ORAL | Status: DC
Start: 2020-07-27 — End: 2020-07-28
  Administered 2020-07-27 – 2020-07-28 (×3): 4 mg via ORAL
  Filled 2020-07-27 (×4): qty 1

## 2020-07-27 MED ORDER — POTASSIUM CHLORIDE CRYS ER 20 MEQ PO TBCR
40.0000 meq | EXTENDED_RELEASE_TABLET | ORAL | Status: AC
  Administered 2020-07-27 (×2): 40 meq via ORAL
  Filled 2020-07-27 (×2): qty 2

## 2020-07-27 MED ORDER — ENOXAPARIN SODIUM 40 MG/0.4ML IJ SOSY
40.0000 mg | PREFILLED_SYRINGE | INTRAMUSCULAR | Status: DC
  Administered 2020-07-27: 40 mg via SUBCUTANEOUS
  Filled 2020-07-27 (×3): qty 0.4

## 2020-07-27 NOTE — Progress Notes (Signed)
PROGRESS NOTE    [name]  CHY:[id number] DOB: August 19, 1980 DOA: 07/26/2020 PCP: Pcp, No    Brief Narrative:  40 year old female with a history of anorexia, admitted to the hospital with decreased p.o. intake for 2 weeks.  She was noted to be tachycardic, dehydrated with starvation ketoacidosis.  She was also hypoglycemic on admission.  She was started on IV fluids with improvement.  Seen by psychiatry.  Anticipate discharge in next 24 hours if labs remain stable.   Assessment & Plan:   Principal Problem:   Anorexia nervosa, restricting type Active Problems:   Anorexia   Palpitation   Sinus tachycardia   Ketotic hypoglycemia   Starvation ketoacidosis   Hypokalemia   Starvation ketoacidosis -Secondary to prolonged decreased p.o. intake -Patient was noted to have ketones in urine -She had significant acidosis with a serum bicarb of 12 -This is improved with IV fluids  Hypoglycemia -Noted to be hypoglycemic on admission -Started on dextrose infusion with improvement of blood sugars -We will discontinue dextrose and monitor blood sugars overnight to ensure stability  Dehydration -Improved with IV fluids  Sinus tachycardia/palpitations -Related to dehydration -Improved  Hypokalemia -Replace -Check magnesium  Anorexia -Seen by psychiatry -Continue Lexapro -Started on cyproheptadine -We will need outpatient follow-up with her therapist and nutritionist.   DVT prophylaxis: enoxaparin (LOVENOX) injection 40 mg Start: 07/27/20 1300  Code Status: full code Family Communication: discussed with patient Disposition Plan: Status is: Inpatient  Remains inpatient appropriate because:IV treatments appropriate due to intensity of illness or inability to take PO   Dispo: The patient is from: Home              Anticipated d/c is to: Home              Patient currently is not medically stable to d/c.   Difficult to place patient No         Consultants:    psychiatry  Procedures:     Antimicrobials:       Subjective: Feels tired, no longer dizzy  Objective: Vitals:   07/27/20 0400 07/27/20 0519 07/27/20 0814 07/27/20 1149  BP: 107/81 102/77 109/74 116/77  Pulse:  72 76 79  Resp:  17 16 16   Temp:  97.8 F (36.6 C) 98.9 F (37.2 C) 99.3 F (37.4 C)  TempSrc:  Oral Oral Oral  SpO2:  93% 100% 100%  Weight:      Height:        Intake/Output Summary (Last 24 hours) at 07/27/2020 1547 Last data filed at 07/27/2020 1542 Gross per 24 hour  Intake 2160.67 ml  Output 475 ml  Net 1685.67 ml   Filed Weights   07/26/20 1136  Weight: 57.4 kg    Examination:  General exam: Appears calm and comfortable  Respiratory system: Clear to auscultation. Respiratory effort normal. Cardiovascular system: S1 & S2 heard, RRR. No JVD, murmurs, rubs, gallops or clicks. No pedal edema. Gastrointestinal system: Abdomen is nondistended, soft and nontender. No organomegaly or masses felt. Normal bowel sounds heard. Central nervous system: Alert and oriented. No focal neurological deficits. Extremities: Symmetric 5 x 5 power. Skin: No rashes, lesions or ulcers Psychiatry: Judgement and insight appear normal. Mood & affect appropriate.     Data Reviewed: I have personally reviewed following labs and imaging studies  CBC: Recent Labs  Lab 07/26/20 1610 07/27/20 0148  WBC 7.3 6.4  NEUTROABS 3.8 2.4  HGB 15.0 12.9  HCT 43.9 37.1  MCV 92.6 89.4  PLT  189 182   Basic Metabolic Panel: Recent Labs  Lab 07/26/20 1610 07/27/20 0148  NA 137 136  K 4.6 3.4*  CL 109 110  CO2 12* 20*  GLUCOSE 61* 111*  BUN 15 9  CREATININE 0.78 0.67  CALCIUM 9.0 8.5*  MG 2.0 1.7  PHOS 3.2  --    GFR: Estimated Creatinine Clearance: 85 mL/min (by C-G formula based on SCr of 0.67 mg/dL). Liver Function Tests: Recent Labs  Lab 07/26/20 1610 07/27/20 0148  AST 28 18  ALT 15 14  ALKPHOS 45 43  BILITOT 1.5* 1.0  PROT 7.9 6.4*  ALBUMIN 4.6 3.6    No results for input(s): LIPASE, AMYLASE in the last 168 hours. No results for input(s): AMMONIA in the last 168 hours. Coagulation Profile: Recent Labs  Lab 07/26/20 1610  INR 1.1   Cardiac Enzymes: No results for input(s): CKTOTAL, CKMB, CKMBINDEX, TROPONINI in the last 168 hours. BNP (last 3 results) No results for input(s): PROBNP in the last 8760 hours. HbA1C: No results for input(s): HGBA1C in the last 72 hours. CBG: Recent Labs  Lab 07/26/20 1714 07/26/20 1737 07/26/20 2131 07/27/20 0753 07/27/20 1146  GLUCAP 59* 106* 142* 111* 101*   Lipid Profile: No results for input(s): CHOL, HDL, LDLCALC, TRIG, CHOLHDL, LDLDIRECT in the last 72 hours. Thyroid Function Tests: Recent Labs    07/26/20 1610  TSH 0.425   Anemia Panel: No results for input(s): VITAMINB12, FOLATE, FERRITIN, TIBC, IRON, RETICCTPCT in the last 72 hours. Sepsis Labs: Recent Labs  Lab 07/26/20 1610  LATICACIDVEN 1.0    Recent Results (from the past 240 hour(s))  SARS CORONAVIRUS 2 (TAT 6-24 HRS) Nasopharyngeal Nasopharyngeal Swab     Status: None   Collection Time: 07/26/20  3:00 PM   Specimen: Nasopharyngeal Swab  Result Value Ref Range Status   SARS Coronavirus 2 NEGATIVE NEGATIVE Final    Comment: (NOTE) SARS-CoV-2 target nucleic acids are NOT DETECTED.  The SARS-CoV-2 RNA is generally detectable in upper and lower respiratory specimens during the acute phase of infection. Negative results do not preclude SARS-CoV-2 infection, do not rule out co-infections with other pathogens, and should not be used as the sole basis for treatment or other patient management decisions. Negative results must be combined with clinical observations, patient history, and epidemiological information. The expected result is Negative.  Fact Sheet for Patients: HairSlick.no  Fact Sheet for Healthcare Providers: quierodirigir.com  This test is not  yet approved or cleared by the [place] FDA and  has been authorized for detection and/or diagnosis of SARS-CoV-2 by FDA under an Emergency Use Authorization (EUA). This EUA will remain  in effect (meaning this test can be used) for the duration of the COVID-19 declaration under Se ction 564(b)(1) of the Act, 21 U.S.C. section 360bbb-3(b)(1), unless the authorization is terminated or revoked sooner.  Performed at [facility], [address], [place], [place] [address]   Culture, blood (Routine X 2) w Reflex to ID Panel     Status: None (Preliminary result)   Collection Time: 07/26/20  3:27 PM   Specimen: BLOOD  Result Value Ref Range Status   Specimen Description BLOOD LEFT ANTECUBITAL  Final   Special Requests   Final    BOTTLES DRAWN AEROBIC AND ANAEROBIC Blood Culture adequate volume   Culture   Final    NO GROWTH < 24 HOURS Performed at [facility], [address] [place], [place] [address]    Report  Status PENDING  Incomplete  Culture, blood (Routine X 2) w Reflex to ID Panel     Status: None (Preliminary result)   Collection Time: 07/26/20  3:34 PM   Specimen: BLOOD  Result Value Ref Range Status   Specimen Description BLOOD RIGHT ANTECUBITAL  Final   Special Requests   Final    BOTTLES DRAWN AEROBIC AND ANAEROBIC Blood Culture results may not be optimal due to an inadequate volume of blood received in culture bottles   Culture   Final    NO GROWTH < 24 HOURS Performed at [facility], [address] [place], [place] [address]    Report Status PENDING  Incomplete         Radiology Studies: DG Chest 1 View  Result Date: 07/26/2020 CLINICAL DATA:  Anorexia with weight loss EXAM: CHEST  1 VIEW COMPARISON:  None. FINDINGS: Lungs are clear. Heart size and pulmonary vascularity are normal. No adenopathy. No bone lesions. IMPRESSION: Lungs clear.  Cardiac silhouette normal. Electronically Signed   By: Bretta Bang III M.D.   On: 07/26/2020  16:15   DG Abd 1 View  Result Date: 07/26/2020 CLINICAL DATA:  Anorexia and weight loss EXAM: ABDOMEN - 1 VIEW COMPARISON:  None. FINDINGS: There is moderate stool in the colon. There is no bowel dilatation or air-fluid level to suggest bowel obstruction. No free air. No abnormal calcifications. Mild soft tissue fullness in the pelvis may represent distended urinary bladder. IMPRESSION: Soft tissue fullness in the pelvis potentially may represent a distended bladder. Advise clinical assessment with respect to possible mass in the pelvis causing appearance of soft tissue prominence in this region. Moderate stool in colon.  No evident bowel obstruction or free air. Electronically Signed   By: Bretta Bang III M.D.   On: 07/26/2020 16:16        Scheduled Meds: . cyproheptadine  4 mg Oral BID  . docusate sodium  100 mg Oral BID  . enoxaparin (LOVENOX) injection  40 mg Subcutaneous Q24H  . escitalopram  20 mg Oral QHS  . potassium chloride  40 mEq Oral Q4H   Continuous Infusions: . lactated ringers 75 mL/hr at 07/27/20 1459     LOS: 1 day    Time spent:    Erick Blinks, MD [facility]   If 7PM-7AM, please contact night-coverage [email or web address]  07/27/2020, 3:47 PM

## 2020-07-27 NOTE — Consult Note (Signed)
[facility] Face-to-Face Psychiatry Consult   Reason for Consult: '' anorexia, not eating x 1 week . . has an outpatient psychiatrist.''  Referring Physician:  Erick Blinks, MD Patient Identification: [name] MRN:  [id number] Principal Diagnosis: Anorexia nervosa, restricting type Diagnosis:  Principal Problem:   Anorexia nervosa, restricting type Active Problems:   Anorexia   Palpitation   Sinus tachycardia   Total Time spent with patient: 1 hour  Subjective:   [name] is a 40 y.o. female patient admitted with decreased food intake and dizziness  HPI: [name] is a 40 y.o. female who reports history of Anorexia nervosa-restricting type dating back to age 86. She was admitted to the hospital due to generalized weakness, dizziness, palpitation after she stopped eating and drinking for almost 2 weeks. Patient reports that prior to the current episode, she had a lot of work stress which made her felt that she was losing control and her only way of getting back in control is to engage in her diet restriction. She states that she has a good dietitian, therapist and psychiatrist that monitor her outside of the hospital. Today, she reports generalized body weakness but no longer feeling dizzy. She denies depression, anxiety, psychosis, delusions and self harming thoughts. She reports that she engages in food restriction from time to time due to intense fear of gaining weight. She denies alcohol and drug abuse.   Past Psychiatric History: as above  Risk to Self:  denies Risk to Others:  denies Prior Inpatient Therapy:  none Prior Outpatient Therapy:  yes  Past Medical History:  Past Medical History:  Diagnosis Date  . Anorexia nervosa, restricting type     Past Surgical History:  Procedure Laterality Date  . ADENOIDECTOMY     Family History:  Family History  Problem Relation Age of Onset  . Cancer Father    Family Psychiatric  History:  Social History:  Social History    Substance and Sexual Activity  Alcohol Use Yes   Comment: Social     Social History   Substance and Sexual Activity  Drug Use Not Currently    Social History   Socioeconomic History  . Marital status: Married    Spouse name: Not on file  . Number of children: 1  . Years of education: PhD  . Highest education level: Doctorate  Occupational History  . Occupation: Tax inspector    Comment: [employer]  Tobacco Use  . Smoking status: Never Smoker  . Smokeless tobacco: Never Used  Vaping Use  . Vaping Use: Never used  Substance and Sexual Activity  . Alcohol use: Yes    Comment: Social  . Drug use: Not Currently  . Sexual activity: Not Currently  Other Topics Concern  . Not on file  Social History Narrative  . Not on file   Social Determinants of Health   Financial Resource Strain: Not on file  Food Insecurity: Not on file  Transportation Needs: Not on file  Physical Activity: Not on file  Stress: Not on file  Social Connections: Not on file   Additional Social History:    Allergies:   Allergies  Allergen Reactions  . Sulfamethoxazole Hives  . Sulfa Antibiotics Hives    Labs:  Results for orders placed or performed during the hospital encounter of 07/26/20 (from the past 48 hour(s))  SARS CORONAVIRUS 2 (TAT 6-24 HRS) Nasopharyngeal Nasopharyngeal Swab     Status: None   Collection Time: 07/26/20  3:00  PM   Specimen: Nasopharyngeal Swab  Result Value Ref Range   SARS Coronavirus 2 NEGATIVE NEGATIVE    Comment: (NOTE) SARS-CoV-2 target nucleic acids are NOT DETECTED.  The SARS-CoV-2 RNA is generally detectable in upper and lower respiratory specimens during the acute phase of infection. Negative results do not preclude SARS-CoV-2 infection, do not rule out co-infections with other pathogens, and should not be used as the sole basis for treatment or other patient management decisions. Negative results must be combined with clinical  observations, patient history, and epidemiological information. The expected result is Negative.  Fact Sheet for Patients: HairSlick.no  Fact Sheet for Healthcare Providers: quierodirigir.com  This test is not yet approved or cleared by the [place] FDA and  has been authorized for detection and/or diagnosis of SARS-CoV-2 by FDA under an Emergency Use Authorization (EUA). This EUA will remain  in effect (meaning this test can be used) for the duration of the COVID-19 declaration under Se ction 564(b)(1) of the Act, 21 U.S.C. section 360bbb-3(b)(1), unless the authorization is terminated or revoked sooner.  Performed at [facility], [address], [place], [place] [address]   Culture, blood (Routine X 2) w Reflex to ID Panel     Status: None (Preliminary result)   Collection Time: 07/26/20  3:27 PM   Specimen: BLOOD  Result Value Ref Range   Specimen Description BLOOD LEFT ANTECUBITAL    Special Requests      BOTTLES DRAWN AEROBIC AND ANAEROBIC Blood Culture adequate volume   Culture      NO GROWTH < 24 HOURS Performed at [facility], [address] [place], [place] [address]    Report Status PENDING   Culture, blood (Routine X 2) w Reflex to ID Panel     Status: None (Preliminary result)   Collection Time: 07/26/20  3:34 PM   Specimen: BLOOD  Result Value Ref Range   Specimen Description BLOOD RIGHT ANTECUBITAL    Special Requests      BOTTLES DRAWN AEROBIC AND ANAEROBIC Blood Culture results may not be optimal due to an inadequate volume of blood received in culture bottles   Culture      NO GROWTH < 24 HOURS Performed at [facility], [address] [place], [place] [address]    Report Status PENDING   CBC with Differential/Platelet     Status: None   Collection Time: 07/26/20  4:10 PM  Result Value Ref Range   WBC 7.3 4.0 - 10.5 K/uL   RBC 4.74 3.87 - 5.11 MIL/uL   Hemoglobin 15.0  12.0 - 15.0 g/dL   HCT 46.9 62.9 - 52.8 %   MCV 92.6 80.0 - 100.0 fL   MCH 31.6 26.0 - 34.0 pg   MCHC 34.2 30.0 - 36.0 g/dL   RDW 41.3 24.4 - 01.0 %   Platelets 189 150 - 400 K/uL   nRBC 0.0 0.0 - 0.2 %   Neutrophils Relative % 52 %   Neutro Abs 3.8 1.7 - 7.7 K/uL   Lymphocytes Relative 35 %   Lymphs Abs 2.6 0.7 - 4.0 K/uL   Monocytes Relative 10 %   Monocytes Absolute 0.8 0.1 - 1.0 K/uL   Eosinophils Relative 2 %   Eosinophils Absolute 0.1 0.0 - 0.5 K/uL   Basophils Relative 1 %   Basophils Absolute 0.1 0.0 - 0.1 K/uL   Immature Granulocytes 0 %   Abs Immature Granulocytes 0.02 0.00 - 0.07 K/uL  Comment: Performed at [facility], [address], [place], [place] [address]  Comprehensive metabolic panel     Status: Abnormal   Collection Time: 07/26/20  4:10 PM  Result Value Ref Range   Sodium 137 135 - 145 mmol/L   Potassium 4.6 3.5 - 5.1 mmol/L   Chloride 109 98 - 111 mmol/L   CO2 12 (L) 22 - 32 mmol/L   Glucose, Bld 61 (L) 70 - 99 mg/dL    Comment: Glucose reference range applies only to samples taken after fasting for at least 8 hours.   BUN 15 6 - 20 mg/dL   Creatinine, Ser 6.56 0.44 - 1.00 mg/dL   Calcium 9.0 8.9 - 81.2 mg/dL   Total Protein 7.9 6.5 - 8.1 g/dL   Albumin 4.6 3.5 - 5.0 g/dL   AST 28 15 - 41 U/L   ALT 15 0 - 44 U/L   Alkaline Phosphatase 45 38 - 126 U/L   Total Bilirubin 1.5 (H) 0.3 - 1.2 mg/dL   GFR, Estimated >75 >17 mL/min    Comment: (NOTE) Calculated using the CKD-EPI Creatinine Equation (2021)    Anion gap 16 (H) 5 - 15    Comment: Performed at [facility], [address] [place], [place] [address]  Protime-INR     Status: None   Collection Time: 07/26/20  4:10 PM  Result Value Ref Range   Prothrombin Time 13.9 11.4 - 15.2 seconds   INR 1.1 0.8 - 1.2    Comment: (NOTE) INR goal varies based on device and disease states. Performed at [facility], [address] [place], [place] [address]   Magnesium     Status:  None   Collection Time: 07/26/20  4:10 PM  Result Value Ref Range   Magnesium 2.0 1.7 - 2.4 mg/dL    Comment: Performed at [facility], [address] [place], [place] [address]  Phosphorus     Status: None   Collection Time: 07/26/20  4:10 PM  Result Value Ref Range   Phosphorus 3.2 2.5 - 4.6 mg/dL    Comment: Performed at [facility], [address] [place], [place] [address]  TSH     Status: None   Collection Time: 07/26/20  4:10 PM  Result Value Ref Range   TSH 0.425 0.350 - 4.500 uIU/mL    Comment: Performed by a 3rd Generation assay with a functional sensitivity of <=0.01 uIU/mL. Performed at [facility], [address], [place], [place] [address]   Lactic acid, plasma     Status: None   Collection Time: 07/26/20  4:10 PM  Result Value Ref Range   Lactic Acid, Venous 1.0 0.5 - 1.9 mmol/L    Comment: Performed at [facility], [address], [place], [place] [address]  Cortisol     Status: None   Collection Time: 07/26/20  4:10 PM  Result Value Ref Range   Cortisol, Plasma 13.4 ug/dL    Comment: (NOTE) AM    6.7 - 22.6 ug/dL PM   <93.9       ug/dL Performed at [facility], [address], [place], [place] [address]   Urinalysis, Routine w reflex microscopic     Status: Abnormal   Collection Time: 07/26/20  5:00 PM  Result Value Ref Range   Color, Urine YELLOW YELLOW   APPearance HAZY (A) CLEAR   Specific Gravity, Urine 1.027 1.005 -  1.030   pH 6.0 5.0 - 8.0   Glucose, UA NEGATIVE NEGATIVE mg/dL   Hgb urine dipstick NEGATIVE NEGATIVE   Bilirubin Urine NEGATIVE NEGATIVE   Ketones, ur 80 (A) NEGATIVE mg/dL   Protein, ur 161 (A) NEGATIVE mg/dL   Nitrite NEGATIVE NEGATIVE   Leukocytes,Ua NEGATIVE NEGATIVE   RBC / HPF 0-5 0 - 5 RBC/hpf   WBC, UA 0-5 0 - 5 WBC/hpf   Bacteria, UA NONE SEEN NONE SEEN   Squamous Epithelial / LPF 0-5 0 - 5   Mucus PRESENT     Comment: Performed at [facility], [address], [place],  [place] [address]  Glucose, capillary     Status: Abnormal   Collection Time: 07/26/20  5:14 PM  Result Value Ref Range   Glucose-Capillary 59 (L) 70 - 99 mg/dL    Comment: Glucose reference range applies only to samples taken after fasting for at least 8 hours.   Comment 1 Notify RN   Glucose, capillary     Status: Abnormal   Collection Time: 07/26/20  5:37 PM  Result Value Ref Range   Glucose-Capillary 106 (H) 70 - 99 mg/dL    Comment: Glucose reference range applies only to samples taken after fasting for at least 8 hours.  Glucose, capillary     Status: Abnormal   Collection Time: 07/26/20  9:31 PM  Result Value Ref Range   Glucose-Capillary 142 (H) 70 - 99 mg/dL    Comment: Glucose reference range applies only to samples taken after fasting for at least 8 hours.  CBC with Differential/Platelet     Status: None   Collection Time: 07/27/20  1:48 AM  Result Value Ref Range   WBC 6.4 4.0 - 10.5 K/uL   RBC 4.15 3.87 - 5.11 MIL/uL   Hemoglobin 12.9 12.0 - 15.0 g/dL   HCT 54.0 98.1 - 19.1 %   MCV 89.4 80.0 - 100.0 fL   MCH 31.1 26.0 - 34.0 pg   MCHC 34.8 30.0 - 36.0 g/dL   RDW 47.8 29.5 - 62.1 %   Platelets 182 150 - 400 K/uL   nRBC 0.0 0.0 - 0.2 %   Neutrophils Relative % 37 %   Neutro Abs 2.4 1.7 - 7.7 K/uL   Lymphocytes Relative 45 %   Lymphs Abs 2.8 0.7 - 4.0 K/uL   Monocytes Relative 13 %   Monocytes Absolute 0.9 0.1 - 1.0 K/uL   Eosinophils Relative 4 %   Eosinophils Absolute 0.3 0.0 - 0.5 K/uL   Basophils Relative 1 %   Basophils Absolute 0.1 0.0 - 0.1 K/uL   Immature Granulocytes 0 %   Abs Immature Granulocytes 0.01 0.00 - 0.07 K/uL    Comment: Performed at [facility], [address] [place], [place] [address]  Comprehensive metabolic panel     Status: Abnormal   Collection Time: 07/27/20  1:48 AM  Result Value Ref Range   Sodium 136 135 - 145 mmol/L   Potassium 3.4 (L) 3.5 - 5.1 mmol/L   Chloride 110 98 - 111 mmol/L   CO2 20 (L) 22 - 32 mmol/L   Glucose, Bld  111 (H) 70 - 99 mg/dL    Comment: Glucose reference range applies only to samples taken after fasting for at least 8 hours.   BUN 9 6 - 20 mg/dL   Creatinine, Ser 7.84 0.44 - 1.00 mg/dL   Calcium 8.5 (L) 8.9 - 10.3 mg/dL   Total Protein  6.4 (L) 6.5 - 8.1 g/dL   Albumin 3.6 3.5 - 5.0 g/dL   AST 18 15 - 41 U/L   ALT 14 0 - 44 U/L   Alkaline Phosphatase 43 38 - 126 U/L   Total Bilirubin 1.0 0.3 - 1.2 mg/dL   GFR, Estimated >40>60 >98>60 mL/min    Comment: (NOTE) Calculated using the CKD-EPI Creatinine Equation (2021)    Anion gap 6 5 - 15    Comment: Performed at [facility], [address] [place], [place] [address]  Glucose, capillary     Status: Abnormal   Collection Time: 07/27/20  7:53 AM  Result Value Ref Range   Glucose-Capillary 111 (H) 70 - 99 mg/dL    Comment: Glucose reference range applies only to samples taken after fasting for at least 8 hours.  Glucose, capillary     Status: Abnormal   Collection Time: 07/27/20 11:46 AM  Result Value Ref Range   Glucose-Capillary 101 (H) 70 - 99 mg/dL    Comment: Glucose reference range applies only to samples taken after fasting for at least 8 hours.    Current Facility-Administered Medications  Medication Dose Route Frequency Provider Last Rate Last Admin  . acetaminophen (TYLENOL) tablet 650 mg  650 mg Oral Q6H PRN Dierdre SearlesLi, Na, MD       Or  . acetaminophen (TYLENOL) suppository 650 mg  650 mg Rectal Q6H PRN Dierdre SearlesLi, Na, MD      . busPIRone (BUSPAR) tablet 5 mg  5 mg Oral TID PRN Dierdre SearlesLi, Na, MD      . cyproheptadine (PERIACTIN) 4 MG tablet 4 mg  4 mg Oral BID Katelan Hirt, MD      . dextrose 5 %-0.9 % sodium chloride infusion   Intravenous Continuous Dierdre SearlesLi, Na, MD 75 mL/hr at 07/27/20 0829 New Bag at 07/27/20 0829  . docusate sodium (COLACE) capsule 100 mg  100 mg Oral BID Dierdre SearlesLi, Na, MD   100 mg at 07/27/20 1016  . enoxaparin (LOVENOX) injection 40 mg  40 mg Subcutaneous Q24H Memon, Durward MallardJehanzeb, MD      . escitalopram (LEXAPRO) tablet 20 mg  20 mg  Oral QHS Dierdre SearlesLi, Na, MD   20 mg at 07/26/20 2132  . ondansetron (ZOFRAN) tablet 4 mg  4 mg Oral Q6H PRN Dierdre SearlesLi, Na, MD       Or  . ondansetron (ZOFRAN) injection 4 mg  4 mg Intravenous Q6H PRN Dede QueryLi, Na, MD        Musculoskeletal: Strength & Muscle Tone: within normal limits Gait & Station: normal Patient leans: N/A   Psychiatric Specialty Exam:  Presentation  General Appearance: Appropriate for Environment  Eye Contact:Good  Speech:Normal Rate  Speech Volume:Normal  Handedness:Right   Mood and Affect  Mood:Euthymic  Affect:Appropriate   Thought Process  Thought Processes:Goal Directed; Linear  Descriptions of Associations:Intact  Orientation:Full (Time, Place and Person)  Thought Content:Abstract Reasoning; Logical  History of Schizophrenia/Schizoaffective disorder:No data recorded Duration of Psychotic Symptoms:No data recorded Hallucinations:Hallucinations: None  Ideas of Reference:None  Suicidal Thoughts:Suicidal Thoughts: No  Homicidal Thoughts:Homicidal Thoughts: No   Sensorium  Memory:Immediate Good; Recent Good; Remote Good  Judgment:Intact  Insight:Fair   Executive Functions  Concentration:Good  Attention Span:Good  Recall:Good  Fund of Knowledge:Good  Language:Good   Psychomotor Activity  Psychomotor Activity:Psychomotor Activity: Normal   Assets  Assets:Communication Skills; Desire for Improvement; Social Support   Sleep  Sleep:Sleep: Good   Physical Exam: Physical Exam Psychiatric:  Attention and Perception: Attention and perception normal.        Mood and Affect: Mood and affect normal.        Speech: Speech normal.        Behavior: Behavior normal. Behavior is cooperative.        Thought Content: Thought content normal.        Cognition and Memory: Cognition and memory normal.        Judgment: Judgment normal.    Review of Systems  Constitutional: Positive for malaise/fatigue.  HENT: Negative.   Eyes:  Negative.   Respiratory: Negative.   Cardiovascular: Negative.   Musculoskeletal: Negative.   Skin: Negative.   Neurological: Negative.   Endo/Heme/Allergies: Negative.   Psychiatric/Behavioral: Negative.    Blood pressure 116/77, pulse 79, temperature 99.3 F (37.4 C), temperature source Oral, resp. rate 16, height 5\' 5"  (1.651 m), weight 57.4 kg, last menstrual period 07/06/2020, SpO2 100 %. Body mass index is 21.07 kg/m.  Treatment Plan Summary: 40 year old female with long history of Anorexia Nervosa who was admitted with generalized body weakness, dizziness and palpitation follow almost 2 weeks of food restriction. She reports some improvement since her admission but she will benefit from these recommendations while she is in the hospital.  Recommendations: -Continue Lexapro 20 mg daily for anxiety/stress -Consider adding Cyproheptadine 4 mg twice daily for appetite stimulation -Dietitian consult-to help with getting back on regular patterns of eating, including providing specific meal plans and calorie requirements for patient to meet her weight goal. -Staff to assist with meal supervisions to ensure that patient is meeting her goal regarding meal consumption -Refer patient to her current dietitian and therapist after she is medically stable. -Monitor patient electrolytes especially potassium level and EKG -Ensure adequate fluid hydration.   Disposition: Patient does not meet criteria for psychiatric inpatient admission. Supportive therapy provided about ongoing stressors. Psychiatric service signing out. Re-consult as needed  24, MD 07/27/2020 12:32 PM

## 2020-07-28 DIAGNOSIS — E43 Unspecified severe protein-calorie malnutrition: Secondary | ICD-10-CM | POA: Insufficient documentation

## 2020-07-28 LAB — RENAL FUNCTION PANEL
Albumin: 3.3 g/dL — ABNORMAL LOW (ref 3.5–5.0)
Anion gap: 5 (ref 5–15)
BUN: 5 mg/dL — ABNORMAL LOW (ref 6–20)
CO2: 26 mmol/L (ref 22–32)
Calcium: 8.9 mg/dL (ref 8.9–10.3)
Chloride: 107 mmol/L (ref 98–111)
Creatinine, Ser: 0.61 mg/dL (ref 0.44–1.00)
GFR, Estimated: >60 mL/min (ref >60)
Glucose, Bld: 86 mg/dL (ref 70–99)
Phosphorus: 3.1 mg/dL (ref 2.5–4.6)
Potassium: 4.1 mmol/L (ref 3.5–5.1)
Sodium: 138 mmol/L (ref 135–145)

## 2020-07-28 LAB — GLUCOSE, CAPILLARY
Glucose-Capillary: 89 mg/dL (ref 70–99)
Glucose-Capillary: 104 mg/dL — ABNORMAL HIGH (ref 70–99)
Glucose-Capillary: 110 mg/dL — ABNORMAL HIGH (ref 70–99)

## 2020-07-28 LAB — MAGNESIUM: Magnesium: 1.6 mg/dL — ABNORMAL LOW (ref 1.7–2.4)

## 2020-07-28 MED ORDER — MAGNESIUM SULFATE 2 GM/50ML IV SOLN
2.0000 g | Freq: Once | INTRAVENOUS | Status: AC
  Administered 2020-07-28: 2 g via INTRAVENOUS
  Filled 2020-07-28: qty 50

## 2020-07-28 MED ORDER — ENSURE ENLIVE PO LIQD
237.0000 mL | Freq: Two times a day (BID) | ORAL | Status: DC
  Administered 2020-07-28 – 2020-07-29 (×3): 237 mL via ORAL

## 2020-07-28 MED ORDER — ADULT MULTIVITAMIN W/MINERALS CH
1.0000 | ORAL_TABLET | Freq: Every day | ORAL | Status: DC
  Administered 2020-07-28 – 2020-07-29 (×2): 1 via ORAL
  Filled 2020-07-28 (×2): qty 1

## 2020-07-28 NOTE — Progress Notes (Signed)
Initial Nutrition Assessment  DOCUMENTATION CODES:   Severe malnutrition in context of chronic illness  INTERVENTION:   Recommend inpatient eating disorder services. Patient does not wish to pursue at this time. RD to place outpatient referral  Recommend discontinuation of appetite stimulant as this is contraindicated in this patient population    Ensure Enlive po BID, each supplement provides 350 kcal and 20 grams of protein  MVI daily   NUTRITION DIAGNOSIS:   Severe Malnutrition related to chronic illness (anorexia nervosa) as evidenced by energy intake < or equal to 75% for > or equal to 1 month,percent weight loss,mild fat depletion,moderate muscle depletion.  GOAL:   Patient will meet greater than or equal to 90% of their needs  MONITOR:   PO intake,Supplement acceptance,Weight trends,Labs,I & O's  REASON FOR ASSESSMENT:   Consult Assessment of nutrition requirement/status  ASSESSMENT:   Patient with PMH significant for anorexia nervosa, restricting type. Presents this admission with decreased oral intake and dehydration with resulting sinus tachycardia.   Patient endorses restricting food since she was 40 years old and has off/on engaged in induced vomiting, diuretic use, and chew/spit. On April 10, 2019 she reports these behaviors worsened and she sought medical attention. She was seen by her PCP that day and an RD the next day (followed up weekly after that for months). It was recommended that she go to an inpatient eating disorder facility but she was unable to go given job responsibilities and being a single mother. When first working with outpatient RD they tried daily food exchanges. She was unable to remember goals that were discussed with her RD. She remembers she was able to increase her intake but noticed she became "obsessed" with measuring food via food scale. It was then recommended she try intuitive eating, but reports this was unsuccessful.   Patient  states she decided to weigh herself two weeks ago and this prompted her to restrict food intake along with increased stress at work. During the week of 5/1-5/7 she consumed a salad with lettuce, tomatoes, and cucumbers on Monday, Wednesday, and Friday. During the week of 5/8-5/14 she did not consume any food products. Prior to this she consumed two meals daily that consisted of B- yogurt with fig bar and D- butter noodles or chicken with rice. Patient states she had a hard time staying hydrated. She consumed coffee and diet coke for the caffeine and had small amounts of water. Also, she endorses two episodes of purging during the last two weeks. Intake this admission has slightly increased. She consumed one boiled eggs, yogurt, and cheerios for breakfast.   Patient reports she weighed 40 lb when she first sought medical attention in Jan 2021. She was able to gain back to 130-135 lb over the last year. Records indicate patient weighed 143 lb on 06/25/20 at [facility] and 126 lb his admission (11.8% wt loss in one month, significant for time frame).   RD dicussed with MD that patient would benefit from going to inpatient eating disorder facility given risk for recurrence and risk for refeeding. Patient does not wish to pursue inpatient facility at this time given her job and need for childcare. RD provided exchange list with a start calorie goal of 1800 kcal (number was not shared with patient). Discussed food options she could use for exchanges and recommended supplement intake if needs are unable to met with meals and snacks.   Patient currently sees her PCP and psychiatrist regularly. She was seeing an RD  but as of recently she has left for maternity leave. Her psychiatrist is currently working on getting a new RD for the time being. We will place a referral for outpatient RD just in case patient is unable to find one. She will need close follow up by PCP and RD as she is a high nutrition risk.   UOP: 700  ml x 24 hrs   Drips: LR @ 75 ml/hr  Medications: colace, MVI with minerals Labs: Mg 1.6 (L)   NUTRITION - FOCUSED PHYSICAL EXAM:  Flowsheet Row Most Recent Value  Orbital Region Mild depletion  Upper Arm Region Mild depletion  Thoracic and Lumbar Region Mild depletion  Buccal Region Mild depletion  Temple Region Moderate depletion  Clavicle Bone Region Moderate depletion  Clavicle and Acromion Bone Region Moderate depletion  Scapular Bone Region Mild depletion  Dorsal Hand Mild depletion  Patellar Region Mild depletion  Anterior Thigh Region Mild depletion  Posterior Calf Region Mild depletion  Edema (RD Assessment) None  Hair Reviewed  Eyes Reviewed  Mouth Reviewed  Skin Reviewed  Nails Reviewed     Diet Order:   Diet Order            Diet regular Room service appropriate? Yes; Fluid consistency: Thin  Diet effective now                 EDUCATION NEEDS:   Education needs have been addressed  Skin:  Skin Assessment: Reviewed RN Assessment  Last BM:  5/16  Height:   Ht Readings from Last 1 Encounters:  07/26/20 5' 5"  (1.651 m)    Weight:   Wt Readings from Last 1 Encounters:  07/26/20 57.4 kg    BMI:  Body mass index is 21.07 kg/m.  Estimated Nutritional Needs:   Kcal:  1900-2100 kcal  Protein:  100-115 grams  Fluid:  >/= 1.9 L/day  Mariana Single RD, LDN Clinical Nutrition Pager listed in [facility]

## 2020-07-28 NOTE — Progress Notes (Signed)
PROGRESS NOTE    [name]  ENI:[id number] DOB: 12-23-1980 DOA: 07/26/2020 PCP: Pcp, No    Brief Narrative:  40 year old female with a history of anorexia, admitted to the hospital with decreased p.o. intake for 2 weeks.  She was noted to be tachycardic, dehydrated with starvation ketoacidosis.  She was also hypoglycemic on admission.  She was started on IV fluids with improvement.  Seen by psychiatry.  Anticipate discharge in next 24 hours if labs remain stable.   Assessment & Plan:   Principal Problem:   Anorexia nervosa, restricting type Active Problems:   Anorexia   Palpitation   Sinus tachycardia   Ketotic hypoglycemia   Starvation ketoacidosis   Hypokalemia   Protein-calorie malnutrition, severe   Starvation ketoacidosis -Secondary to prolonged decreased p.o. intake -Patient was noted to have ketones in urine -She had significant acidosis with a serum bicarb of 12 -This is improved with IV fluids  Hypoglycemia -Noted to be hypoglycemic on admission -Started on dextrose infusion with improvement of blood sugars -Dextrose has since been continued and blood sugars remained stable with p.o. intake  Dehydration -Improved with IV fluids  Sinus tachycardia/palpitations -Related to dehydration -Improved  Hypokalemia -Replace -Magnesium is also being replaced  Anorexia -Seen by psychiatry, not felt to be a candidate for inpatient psychiatric treatment -Continue Lexapro -We will need outpatient follow-up with her therapist and nutritionist. -She is closely followed up by PCP and has an appointment this Friday -With patient's permission, her case was discussed in detail with her therapist, Mike Craze -We will continue to monitor p.o. intake for another 24 hours, recheck labs for refeeding syndrome tomorrow    DVT prophylaxis: enoxaparin (LOVENOX) injection 40 mg Start: 07/27/20 1300  Code Status: full code Family Communication: discussed with  patient Disposition Plan: Status is: Inpatient  Remains inpatient appropriate because:IV treatments appropriate due to intensity of illness or inability to take PO   Dispo: The patient is from: Home              Anticipated d/c is to: Home              Patient currently is not medically stable to d/c.   Difficult to place patient No         Consultants:   psychiatry  Procedures:     Antimicrobials:       Subjective: She reports that dizziness is better today.  She did have breakfast and lunch.  Staff does confirm that she is eating all of her meals.  Objective: Vitals:   07/28/20 0446 07/28/20 0742 07/28/20 1203 07/28/20 1618  BP: 106/68 104/78 102/74 102/81  Pulse:  72 92 100  Resp: 16 16 16 16   Temp: 98.8 F (37.1 C) 99.1 F (37.3 C) 99.3 F (37.4 C) 99.1 F (37.3 C)  TempSrc: Oral Oral Oral Oral  SpO2: 98%  93% 96%  Weight:      Height:        Intake/Output Summary (Last 24 hours) at 07/28/2020 1642 Last data filed at 07/28/2020 1506 Gross per 24 hour  Intake 2170.2 ml  Output --  Net 2170.2 ml   Filed Weights   07/26/20 1136  Weight: 57.4 kg    Examination:  General exam: Alert, awake, oriented x 3 Respiratory system: Clear to auscultation. Respiratory effort normal. Cardiovascular system:RRR. No murmurs, rubs, gallops. Gastrointestinal system: Abdomen is nondistended, soft and nontender. No organomegaly or masses felt. Normal bowel sounds heard. Central nervous system: Alert and oriented.  No focal neurological deficits. Extremities: No C/C/E, +pedal pulses Skin: No rashes, lesions or ulcers Psychiatry: Judgement and insight appear normal. Mood & affect appropriate.    Data Reviewed: I have personally reviewed following labs and imaging studies  CBC: Recent Labs  Lab 07/26/20 1610 07/27/20 0148  WBC 7.3 6.4  NEUTROABS 3.8 2.4  HGB 15.0 12.9  HCT 43.9 37.1  MCV 92.6 89.4  PLT 189 182   Basic Metabolic Panel: Recent Labs  Lab  07/26/20 1610 07/27/20 0148 07/28/20 0344  NA 137 136 138  K 4.6 3.4* 4.1  CL 109 110 107  CO2 12* 20* 26  GLUCOSE 61* 111* 86  BUN 15 9 5*  CREATININE 0.78 0.67 0.61  CALCIUM 9.0 8.5* 8.9  MG 2.0 1.7 1.6*  PHOS 3.2  --  3.1   GFR: Estimated Creatinine Clearance: 85 mL/min (by C-G formula based on SCr of 0.61 mg/dL). Liver Function Tests: Recent Labs  Lab 07/26/20 1610 07/27/20 0148 07/28/20 0344  AST 28 18  --   ALT 15 14  --   ALKPHOS 45 43  --   BILITOT 1.5* 1.0  --   PROT 7.9 6.4*  --   ALBUMIN 4.6 3.6 3.3*   No results for input(s): LIPASE, AMYLASE in the last 168 hours. No results for input(s): AMMONIA in the last 168 hours. Coagulation Profile: Recent Labs  Lab 07/26/20 1610  INR 1.1   Cardiac Enzymes: No results for input(s): CKTOTAL, CKMB, CKMBINDEX, TROPONINI in the last 168 hours. BNP (last 3 results) No results for input(s): PROBNP in the last 8760 hours. HbA1C: No results for input(s): HGBA1C in the last 72 hours. CBG: Recent Labs  Lab 07/27/20 1616 07/27/20 2127 07/28/20 0743 07/28/20 1200 07/28/20 1616  GLUCAP 95 98 89 104* 110*   Lipid Profile: No results for input(s): CHOL, HDL, LDLCALC, TRIG, CHOLHDL, LDLDIRECT in the last 72 hours. Thyroid Function Tests: Recent Labs    07/26/20 1610  TSH 0.425   Anemia Panel: No results for input(s): VITAMINB12, FOLATE, FERRITIN, TIBC, IRON, RETICCTPCT in the last 72 hours. Sepsis Labs: Recent Labs  Lab 07/26/20 1610  LATICACIDVEN 1.0    Recent Results (from the past 240 hour(s))  SARS CORONAVIRUS 2 (TAT 6-24 HRS) Nasopharyngeal Nasopharyngeal Swab     Status: None   Collection Time: 07/26/20  3:00 PM   Specimen: Nasopharyngeal Swab  Result Value Ref Range Status   SARS Coronavirus 2 NEGATIVE NEGATIVE Final    Comment: (NOTE) SARS-CoV-2 target nucleic acids are NOT DETECTED.  The SARS-CoV-2 RNA is generally detectable in upper and lower respiratory specimens during the acute phase of  infection. Negative results do not preclude SARS-CoV-2 infection, do not rule out co-infections with other pathogens, and should not be used as the sole basis for treatment or other patient management decisions. Negative results must be combined with clinical observations, patient history, and epidemiological information. The expected result is Negative.  Fact Sheet for Patients: HairSlick.no  Fact Sheet for Healthcare Providers: quierodirigir.com  This test is not yet approved or cleared by the [place] FDA and  has been authorized for detection and/or diagnosis of SARS-CoV-2 by FDA under an Emergency Use Authorization (EUA). This EUA will remain  in effect (meaning this test can be used) for the duration of the COVID-19 declaration under Se ction 564(b)(1) of the Act, 21 U.S.C. section 360bbb-3(b)(1), unless the authorization is terminated or revoked sooner.  Performed at [facility], [address], [place], [place] [address]   Culture, blood (Routine X 2) w Reflex to ID Panel     Status: None (Preliminary result)   Collection Time: 07/26/20  3:27 PM   Specimen: BLOOD  Result Value Ref Range Status   Specimen Description BLOOD LEFT ANTECUBITAL  Final   Special Requests   Final    BOTTLES DRAWN AEROBIC AND ANAEROBIC Blood Culture adequate volume   Culture   Final    NO GROWTH 2 DAYS Performed at [facility], [address], [place], [place] [address]    Report Status PENDING  Incomplete  Culture, blood (Routine X 2) w Reflex to ID Panel     Status: None (Preliminary result)   Collection Time: 07/26/20  3:34 PM   Specimen: BLOOD  Result Value Ref Range Status   Specimen Description BLOOD RIGHT ANTECUBITAL  Final   Special Requests   Final    BOTTLES DRAWN AEROBIC AND ANAEROBIC Blood Culture results may not be optimal due to an inadequate volume of blood received in culture bottles   Culture   Final     NO GROWTH 2 DAYS Performed at [facility], [address] [place], [place] [address]    Report Status PENDING  Incomplete         Radiology Studies: No results found.      Scheduled Meds: . docusate sodium  100 mg Oral BID  . enoxaparin (LOVENOX) injection  40 mg Subcutaneous Q24H  . escitalopram  20 mg Oral QHS  . feeding supplement  237 mL Oral BID BM  . multivitamin with minerals  1 tablet Oral Daily   Continuous Infusions: . lactated ringers 75 mL/hr at 07/28/20 0444     LOS: 2 days    Time spent:    Erick Blinks, MD [facility]   If 7PM-7AM, please contact night-coverage [email or web address]  07/28/2020, 4:42 PM

## 2020-07-29 DIAGNOSIS — E43 Unspecified severe protein-calorie malnutrition: Secondary | ICD-10-CM

## 2020-07-29 LAB — RENAL FUNCTION PANEL
Albumin: 3.4 g/dL — ABNORMAL LOW (ref 3.5–5.0)
Anion gap: 5 (ref 5–15)
BUN: 11 mg/dL (ref 6–20)
CO2: 30 mmol/L (ref 22–32)
Calcium: 9.1 mg/dL (ref 8.9–10.3)
Chloride: 103 mmol/L (ref 98–111)
Creatinine, Ser: 0.77 mg/dL (ref 0.44–1.00)
GFR, Estimated: >60 mL/min (ref >60)
Glucose, Bld: 96 mg/dL (ref 70–99)
Phosphorus: 4.2 mg/dL (ref 2.5–4.6)
Potassium: 4.3 mmol/L (ref 3.5–5.1)
Sodium: 138 mmol/L (ref 135–145)

## 2020-07-29 LAB — GLUCOSE, CAPILLARY
Glucose-Capillary: 105 mg/dL — ABNORMAL HIGH (ref 70–99)
Glucose-Capillary: 106 mg/dL — ABNORMAL HIGH (ref 70–99)

## 2020-07-29 LAB — MAGNESIUM: Magnesium: 2 mg/dL (ref 1.7–2.4)

## 2020-07-29 MED ORDER — ADULT MULTIVITAMIN W/MINERALS CH: 1.0000 | ORAL_TABLET | Freq: Every day | ORAL | 0 refills | Status: DC

## 2020-07-29 MED ORDER — ENSURE ENLIVE PO LIQD: 237.0000 mL | Freq: Two times a day (BID) | ORAL | 12 refills | Status: DC

## 2020-07-29 NOTE — Discharge Summary (Signed)
Physician Discharge Summary  [name] PPI:[id number] DOB: April 28, 1980 DOA: 07/26/2020  PCP: Pcp, No  Admit date: 07/26/2020 Discharge date: 07/29/2020  Admitted From: home Disposition:  home  Recommendations for Outpatient Follow-up:  1. Patient already has appointment to follow up with PCP on friday 2. Please follow electrolytes closely as she starts to refeed 3. She has established care with her therapist, Mike Craze 4. She also plans to follow up with her nutritionist. A second referral to [facility] has also been requested, in case she has trouble getting in with her nutritionist  Home Health: Equipment/Devices:  Discharge Condition:stable CODE STATUS:full code Diet recommendation: regular diet  Brief/Interim Summary: 40 year old female with a history of anorexia, admitted to the hospital with decreased p.o. intake for 2 weeks.  She was noted to be tachycardic, dehydrated with starvation ketoacidosis.  She was also hypoglycemic on admission.  She was started on IV fluids with improvement.  Seen by psychiatry and nutrition  Discharge Diagnoses:  Principal Problem:   Anorexia nervosa, restricting type Active Problems:   Anorexia   Palpitation   Sinus tachycardia   Ketotic hypoglycemia   Starvation ketoacidosis   Hypokalemia   Protein-calorie malnutrition, severe  Starvation ketoacidosis -Secondary to prolonged decreased p.o. intake -Patient was noted to have ketones in urine -She had significant acidosis with a serum bicarb of 12 -This is resolved with IV fluids  Hypoglycemia -Noted to be hypoglycemic on admission -Started on dextrose infusion with improvement of blood sugars -Dextrose has since been continued and blood sugars remained stable with p.o. intake  Dehydration -Improved with IV fluids  Sinus tachycardia/palpitations -Related to dehydration -Improved  Hypokalemia -Replaced -Magnesium is improved  Anorexia -Seen by psychiatry,  not felt to be a candidate for inpatient psychiatric treatment -Continue Lexapro -She will need outpatient follow-up with her therapist and nutritionist. -She is closely followed up by PCP and has an appointment this Friday -With patient's permission, her case was discussed in detail with her therapist, Mike Craze -she was monitored for 72 hours and consumed 75-100% of her meals. -she is planning close follow up with her nutritionist, therapist and pcp  Discharge Instructions  Discharge Instructions    Ambulatory referral to [facility]   Complete by: As directed    For Theresa Doyne, PhD, RD, LDN   Diet - low sodium heart healthy   Complete by: As directed    Increase activity slowly   Complete by: As directed      Allergies as of 07/29/2020      Reactions   Sulfamethoxazole Hives   Sulfa Antibiotics Hives      Medication List    TAKE these medications   busPIRone 5 MG tablet Commonly known as: BUSPAR Take 5 mg by mouth 3 (three) times daily as needed (for anxiety).   escitalopram 20 MG tablet Commonly known as: LEXAPRO Take 20 mg by mouth at bedtime. What changed: Another medication with the same name was removed. Continue taking this medication, and follow the directions you see here.   feeding supplement Liqd Take 237 mLs by mouth 2 (two) times daily between meals.   multivitamin with minerals Tabs tablet Take 1 tablet by mouth daily. Start taking on: Jul 30, 2020       Allergies  Allergen Reactions  . Sulfamethoxazole Hives  . Sulfa Antibiotics Hives    Consultations:  psychiatry   Procedures/Studies: DG Chest 1 View  Result Date: 07/26/2020 CLINICAL DATA:  Anorexia with weight loss EXAM: CHEST  1 VIEW COMPARISON:  None. FINDINGS: Lungs are clear. Heart size and pulmonary vascularity are normal. No adenopathy. No bone lesions. IMPRESSION: Lungs clear.  Cardiac silhouette normal. Electronically Signed   By: Bretta Bang III M.D.   On:  07/26/2020 16:15   DG Abd 1 View  Result Date: 07/26/2020 CLINICAL DATA:  Anorexia and weight loss EXAM: ABDOMEN - 1 VIEW COMPARISON:  None. FINDINGS: There is moderate stool in the colon. There is no bowel dilatation or air-fluid level to suggest bowel obstruction. No free air. No abnormal calcifications. Mild soft tissue fullness in the pelvis may represent distended urinary bladder. IMPRESSION: Soft tissue fullness in the pelvis potentially may represent a distended bladder. Advise clinical assessment with respect to possible mass in the pelvis causing appearance of soft tissue prominence in this region. Moderate stool in colon.  No evident bowel obstruction or free air. Electronically Signed   By: Bretta Bang III M.D.   On: 07/26/2020 16:16       Subjective: Feeling better this morning. Ate oatmeal and fruit for breakfast and a chicken sandwich for lunch. She also been drinking ensure shakes. She denies dizziness  Discharge Exam: Vitals:   07/28/20 1942 07/28/20 2020 07/28/20 2323 07/29/20 0507  BP: 105/78  97/71 106/79  Pulse:      Resp: 18  16 18   Temp: 99 F (37.2 C)  99.7 F (37.6 C) 98.8 F (37.1 C)  TempSrc: Oral  Oral Oral  SpO2:  97%    Weight:      Height:        General: Pt is alert, awake, not in acute distress Cardiovascular: RRR, S1/S2 +, no rubs, no gallops Respiratory: CTA bilaterally, no wheezing, no rhonchi Abdominal: Soft, NT, ND, bowel sounds + Extremities: no edema, no cyanosis    The results of significant diagnostics from this hospitalization (including imaging, microbiology, ancillary and laboratory) are listed below for reference.     Microbiology: Recent Results (from the past 240 hour(s))  SARS CORONAVIRUS 2 (TAT 6-24 HRS) Nasopharyngeal Nasopharyngeal Swab     Status: None   Collection Time: 07/26/20  3:00 PM   Specimen: Nasopharyngeal Swab  Result Value Ref Range Status   SARS Coronavirus 2 NEGATIVE NEGATIVE Final    Comment:  (NOTE) SARS-CoV-2 target nucleic acids are NOT DETECTED.  The SARS-CoV-2 RNA is generally detectable in upper and lower respiratory specimens during the acute phase of infection. Negative results do not preclude SARS-CoV-2 infection, do not rule out co-infections with other pathogens, and should not be used as the sole basis for treatment or other patient management decisions. Negative results must be combined with clinical observations, patient history, and epidemiological information. The expected result is Negative.  Fact Sheet for Patients: 07/28/20  Fact Sheet for Healthcare Providers: HairSlick.no  This test is not yet approved or cleared by the [place] FDA and  has been authorized for detection and/or diagnosis of SARS-CoV-2 by FDA under an Emergency Use Authorization (EUA). This EUA will remain  in effect (meaning this test can be used) for the duration of the COVID-19 declaration under Se ction 564(b)(1) of the Act, 21 U.S.C. section 360bbb-3(b)(1), unless the authorization is terminated or revoked sooner.  Performed at [facility], [address], [place], [place]   Culture, blood (Routine X 2) w Reflex to ID Panel     Status: None (Preliminary result)   Collection Time: 07/26/20  3:27 PM   Specimen: BLOOD  Result  Value Ref Range Status   Specimen Description BLOOD LEFT ANTECUBITAL  Final   Special Requests   Final    BOTTLES DRAWN AEROBIC AND ANAEROBIC Blood Culture adequate volume   Culture   Final    NO GROWTH 3 DAYS Performed at [facility], [address] [place], [place] [address]    Report Status PENDING  Incomplete  Culture, blood (Routine X 2) w Reflex to ID Panel     Status: None (Preliminary result)   Collection Time: 07/26/20  3:34 PM   Specimen: BLOOD  Result Value Ref Range Status   Specimen Description BLOOD RIGHT ANTECUBITAL  Final   Special Requests    Final    BOTTLES DRAWN AEROBIC AND ANAEROBIC Blood Culture results may not be optimal due to an inadequate volume of blood received in culture bottles   Culture   Final    NO GROWTH 3 DAYS Performed at [facility], [address] [place], [place] [address]    Report Status PENDING  Incomplete     Labs: BNP (last 3 results) No results for input(s): BNP in the last 8760 hours. Basic Metabolic Panel: Recent Labs  Lab 07/26/20 1610 07/27/20 0148 07/28/20 0344 07/29/20 0247  NA 137 136 138 138  K 4.6 3.4* 4.1 4.3  CL 109 110 107 103  CO2 12* 20* 26 30  GLUCOSE 61* 111* 86 96  BUN 15 9 5* 11  CREATININE 0.78 0.67 0.61 0.77  CALCIUM 9.0 8.5* 8.9 9.1  MG 2.0 1.7 1.6* 2.0  PHOS 3.2  --  3.1 4.2   Liver Function Tests: Recent Labs  Lab 07/26/20 1610 07/27/20 0148 07/28/20 0344 07/29/20 0247  AST 28 18  --   --   ALT 15 14  --   --   ALKPHOS 45 43  --   --   BILITOT 1.5* 1.0  --   --   PROT 7.9 6.4*  --   --   ALBUMIN 4.6 3.6 3.3* 3.4*   No results for input(s): LIPASE, AMYLASE in the last 168 hours. No results for input(s): AMMONIA in the last 168 hours. CBC: Recent Labs  Lab 07/26/20 1610 07/27/20 0148  WBC 7.3 6.4  NEUTROABS 3.8 2.4  HGB 15.0 12.9  HCT 43.9 37.1  MCV 92.6 89.4  PLT 189 182   Cardiac Enzymes: No results for input(s): CKTOTAL, CKMB, CKMBINDEX, TROPONINI in the last 168 hours. BNP: Invalid input(s): POCBNP CBG: Recent Labs  Lab 07/28/20 0743 07/28/20 1200 07/28/20 1616 07/29/20 0749 07/29/20 1141  GLUCAP 89 104* 110* 105* 106*   D-Dimer No results for input(s): DDIMER in the last 72 hours. Hgb A1c No results for input(s): HGBA1C in the last 72 hours. Lipid Profile No results for input(s): CHOL, HDL, LDLCALC, TRIG, CHOLHDL, LDLDIRECT in the last 72 hours. Thyroid function studies Recent Labs    07/26/20 1610  TSH 0.425   Anemia work up No results for input(s): VITAMINB12, FOLATE, FERRITIN, TIBC, IRON, RETICCTPCT in the  last 72 hours. Urinalysis    Component Value Date/Time   COLORURINE YELLOW 07/26/2020 1700   APPEARANCEUR HAZY (A) 07/26/2020 1700   LABSPEC 1.027 07/26/2020 1700   PHURINE 6.0 07/26/2020 1700   GLUCOSEU NEGATIVE 07/26/2020 1700   HGBUR NEGATIVE 07/26/2020 1700   BILIRUBINUR NEGATIVE 07/26/2020 1700   KETONESUR 80 (A) 07/26/2020 1700   PROTEINUR 100 (A) 07/26/2020 1700   NITRITE NEGATIVE 07/26/2020 1700   LEUKOCYTESUR NEGATIVE 07/26/2020 1700  Sepsis Labs Invalid input(s): PROCALCITONIN,  WBC,  LACTICIDVEN Microbiology Recent Results (from the past 240 hour(s))  SARS CORONAVIRUS 2 (TAT 6-24 HRS) Nasopharyngeal Nasopharyngeal Swab     Status: None   Collection Time: 07/26/20  3:00 PM   Specimen: Nasopharyngeal Swab  Result Value Ref Range Status   SARS Coronavirus 2 NEGATIVE NEGATIVE Final    Comment: (NOTE) SARS-CoV-2 target nucleic acids are NOT DETECTED.  The SARS-CoV-2 RNA is generally detectable in upper and lower respiratory specimens during the acute phase of infection. Negative results do not preclude SARS-CoV-2 infection, do not rule out co-infections with other pathogens, and should not be used as the sole basis for treatment or other patient management decisions. Negative results must be combined with clinical observations, patient history, and epidemiological information. The expected result is Negative.  Fact Sheet for Patients: HairSlick.nohttps://www.fda.gov/media/138098/download  Fact Sheet for Healthcare Providers: quierodirigir.comhttps://www.fda.gov/media/138095/download  This test is not yet approved or cleared by the [place] FDA and  has been authorized for detection and/or diagnosis of SARS-CoV-2 by FDA under an Emergency Use Authorization (EUA). This EUA will remain  in effect (meaning this test can be used) for the duration of the COVID-19 declaration under Se ction 564(b)(1) of the Act, 21 U.S.C. section 360bbb-3(b)(1), unless the authorization is terminated  or revoked sooner.  Performed at [facility], [address] [place], [place] [address]   Culture, blood (Routine X 2) w Reflex to ID Panel     Status: None (Preliminary result)   Collection Time: 07/26/20  3:27 PM   Specimen: BLOOD  Result Value Ref Range Status   Specimen Description BLOOD LEFT ANTECUBITAL  Final   Special Requests   Final    BOTTLES DRAWN AEROBIC AND ANAEROBIC Blood Culture adequate volume   Culture   Final    NO GROWTH 3 DAYS Performed at [facility], [address] [place], [place] [address]    Report Status PENDING  Incomplete  Culture, blood (Routine X 2) w Reflex to ID Panel     Status: None (Preliminary result)   Collection Time: 07/26/20  3:34 PM   Specimen: BLOOD  Result Value Ref Range Status   Specimen Description BLOOD RIGHT ANTECUBITAL  Final   Special Requests   Final    BOTTLES DRAWN AEROBIC AND ANAEROBIC Blood Culture results may not be optimal due to an inadequate volume of blood received in culture bottles   Culture   Final    NO GROWTH 3 DAYS Performed at [facility], [address] [place], [place] [address]    Report Status PENDING  Incomplete     Time coordinating discharge: 35mins  SIGNED:   Erick BlinksJehanzeb Marybel Alcott, MD  [facility] 07/29/2020, 2:41 PM   If 7PM-7AM, please contact night-coverage [email or web address]

## 2020-07-31 LAB — CULTURE, BLOOD (ROUTINE X 2)
Culture: NO GROWTH
Culture: NO GROWTH
Special Requests: ADEQUATE

## 2021-10-14 IMAGING — DX DG CHEST 1V
1 series · 1 of 1 positions shown · non-contrast
Comparison: None.

CLINICAL DATA: Anorexia with weight loss

EXAM:
CHEST  1 VIEW

[chest ap]
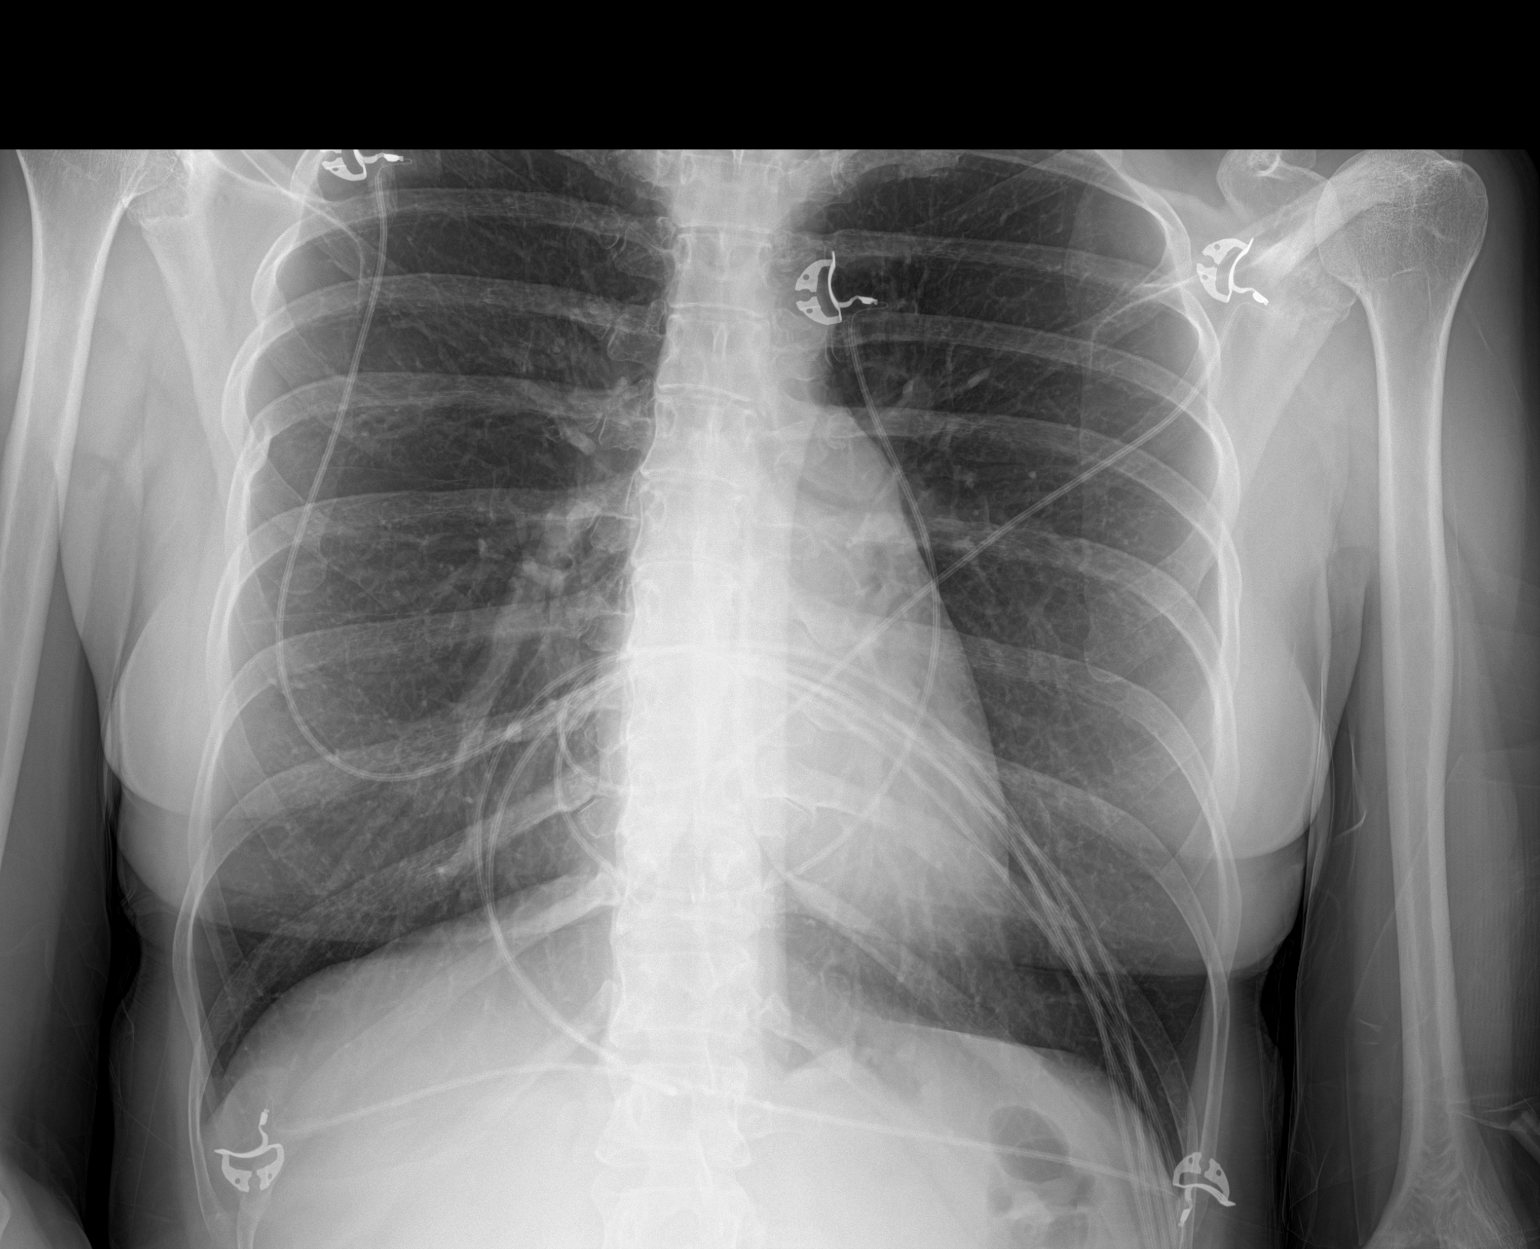

[1 of 1 positions shown; findings below may reference images not displayed]

FINDINGS: Lungs are clear. Heart size and pulmonary vascularity are normal. No
adenopathy. No bone lesions.
IMPRESSION: Lungs clear.  Cardiac silhouette normal.

## 2021-10-14 IMAGING — DX DG ABDOMEN 1V
1 series · 1 of 1 positions shown · non-contrast
Comparison: None.

CLINICAL DATA: Anorexia and weight loss

EXAM:
ABDOMEN - 1 VIEW

[abdomen kub]
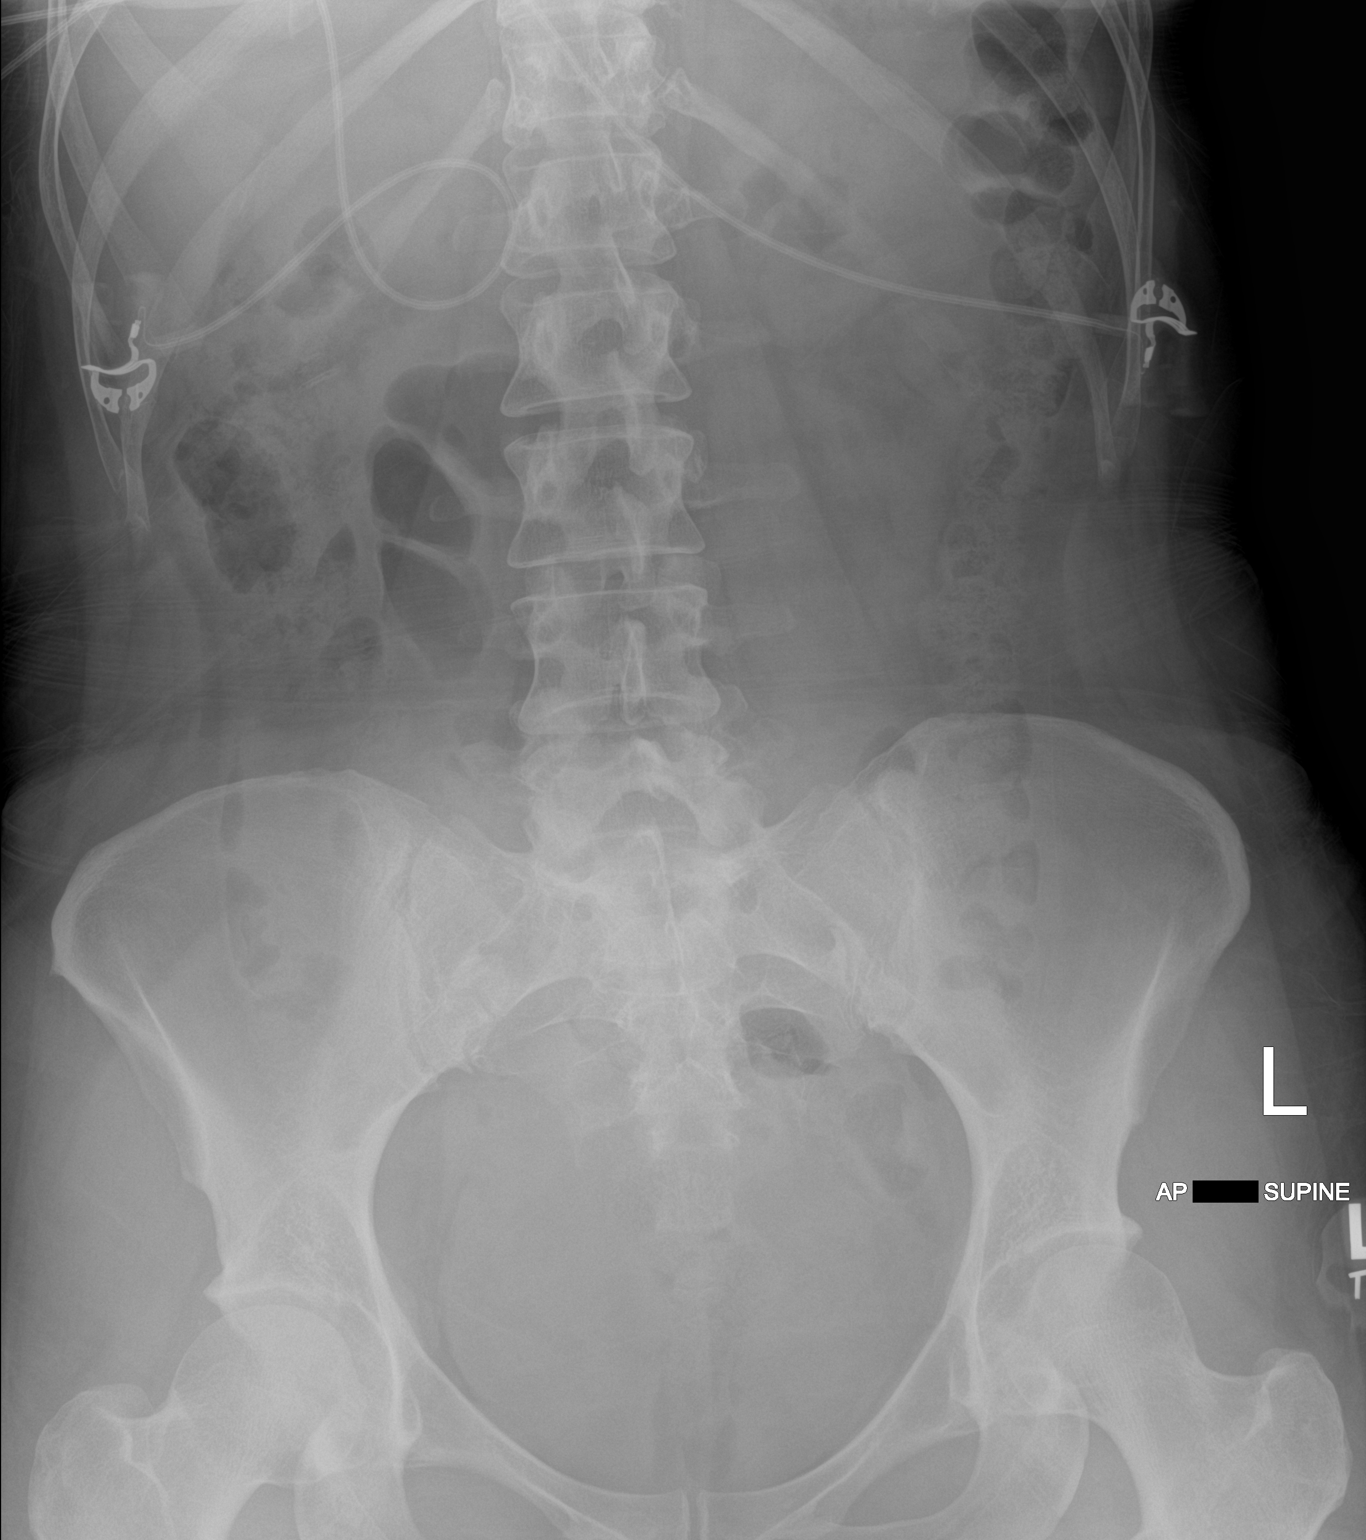

[1 of 1 positions shown; findings below may reference images not displayed]

FINDINGS: There is moderate stool in the colon. There is no bowel dilatation
or air-fluid level to suggest bowel obstruction. No free air. No
abnormal calcifications. Mild soft tissue fullness in the pelvis may
represent distended urinary bladder.
IMPRESSION: Soft tissue fullness in the pelvis potentially may represent a
distended bladder. Advise clinical assessment with respect to
possible mass in the pelvis causing appearance of soft tissue
prominence in this region.

Moderate stool in colon.  No evident bowel obstruction or free air.

## 2021-11-11 ENCOUNTER — Ambulatory Visit: Payer: BC Managed Care – PPO | Admitting: Radiology

## 2021-11-11 ENCOUNTER — Encounter: Payer: Self-pay | Admitting: Radiology

## 2021-11-11 VITALS — BP 116/78 | Ht 64.0 in | Wt 162.0 lb

## 2021-11-11 DIAGNOSIS — Z3009 Encounter for other general counseling and advice on contraception: Secondary | ICD-10-CM

## 2021-11-11 NOTE — Progress Notes (Signed)
   [name] Nov 14, 1980 [id number]   History:  41 y.o. G1P1 presents for birth control consult. Referred by PCP. Interested in IUD. OCPs and nuva ring caused menorrhagia.   Gynecologic History Patient's last menstrual period was 10/25/2021 (approximate). Period Cycle (Days): 28 Period Duration (Days): 5 Period Pattern: Regular Menstrual Flow: Moderate Menstrual Control: Other (Comment) (menstrual cup) Dysmenorrhea: (!) Mild Dysmenorrhea Symptoms: Cramping Contraception/Family planning: condoms Sexually active: yes Last Pap: 2021. Results were: normal   Obstetric History OB History  Gravida Para Term Preterm AB Living  1 1   1   1   SAB IAB Ectopic Multiple Live Births               # Outcome Date GA Lbr Len/2nd Weight Sex Delivery Anes PTL Lv  1 Preterm              The following portions of the patient's history were reviewed and updated as appropriate: allergies, current medications, past family history, past medical history, past social history, past surgical history, and problem list.  Review of Systems Pertinent items noted in HPI and remainder of comprehensive ROS otherwise negative.   Past medical history, past surgical history, family history and social history were all reviewed and documented in the EPIC chart.   Exam:  Vitals:   11/11/21 1420  BP: 116/78  Weight: 162 lb (73.5 kg)  Height: 5\' 4"  (1.626 m)   Body mass index is 27.81 kg/m.  Physical Exam Vitals reviewed.  Constitutional:      Appearance: Normal appearance. She is normal weight.  HENT:     Head: Normocephalic.  Cardiovascular:     Rate and Rhythm: Normal rate.  Pulmonary:     Effort: Pulmonary effort is normal.  Skin:    General: Skin is warm and dry.  Neurological:     Mental Status: She is alert.  Psychiatric:        Mood and Affect: Mood normal.        Thought Content: Thought content normal.        Judgment: Judgment normal.      Assessment/Plan:   1. Encounter for  general counseling and advice on contraceptive management Schedule Mirena insertion with next menses - IUD Insertion; Future     11/13/21 B WHNP-BC 2:48 PM 11/11/2021

## 2021-11-23 ENCOUNTER — Ambulatory Visit (INDEPENDENT_AMBULATORY_CARE_PROVIDER_SITE_OTHER): Payer: BC Managed Care – PPO | Admitting: Radiology

## 2021-11-23 ENCOUNTER — Encounter: Payer: Self-pay | Admitting: Radiology

## 2021-11-23 VITALS — BP 124/80 | Ht 64.0 in | Wt 162.0 lb

## 2021-11-23 DIAGNOSIS — Z30430 Encounter for insertion of intrauterine contraceptive device: Secondary | ICD-10-CM | POA: Diagnosis not present

## 2021-11-23 DIAGNOSIS — Z01812 Encounter for preprocedural laboratory examination: Secondary | ICD-10-CM

## 2021-11-23 DIAGNOSIS — Z3009 Encounter for other general counseling and advice on contraception: Secondary | ICD-10-CM

## 2021-11-23 DIAGNOSIS — Z113 Encounter for screening for infections with a predominantly sexual mode of transmission: Secondary | ICD-10-CM

## 2021-11-23 LAB — PREGNANCY, URINE: Preg Test, Ur: NEGATIVE

## 2021-11-23 MED ORDER — LEVONORGESTREL 20 MCG/DAY IU IUD: 1.0000 | INTRAUTERINE_SYSTEM | Freq: Once | INTRAUTERINE | Status: AC

## 2021-11-23 NOTE — Progress Notes (Signed)
   [name] 06-14-1980 [id number]   History:  41 y.o. G1P1 presents for insertion of Mirena IUD.  Pt has been counseled about risks and benefits as well as complications.  Consent is obtained today.  Patient's last menstrual period was 10/25/2021 (approximate). GC/CT/Trich testing: obtained  Past medical history, past surgical history, family history and social history were all reviewed and documented in the EPIC chart.  ROS:  A ROS was performed and pertinent positives and negatives are included.  Exam: Vitals:   11/23/21 1351  Weight: 162 lb (73.5 kg)  Height: 5\' 4"  (1.626 m)   Body mass index is 27.81 kg/m.  Pelvic exam: Vulva:  normal female genitalia Vagina:  normal vagina, no discharge, exudate, lesion, or erythema Cervix:  Non-tender, Negative CMT, no lesions or redness. Uterus:  normal shape, position and consistency    Procedure:  Speculum inserted.   Cervix visualized and cleansed with Betadine x 3.  Tenaculum placed on anterior cervix. Then uterus sounded to 8 cm. IUD inserted easily. Strings trimmed to 3 cm.  Minimal bleeding noted.  Pt tolerated the procedure well.  Chaperone present: , CMA   Assessment/Plan:  Insertion of Mirena IUD             UPT neg   Return for recheck 4-6 weeks Pt aware to call for any concerns Pt aware removal due no later than 11/23/2029, IUD card given to pt.   01/23/2030 B WHNP-BC, 1:54 PM 11/23/2021

## 2021-11-23 NOTE — Addendum Note (Signed)
Addended by: Tito Dine on: 11/23/2021 02:41 PM   Modules accepted: Orders

## 2021-11-24 LAB — SURESWAB CT/NG/T. VAGINALIS
C. trachomatis RNA, TMA: NOT DETECTED
N. gonorrhoeae RNA, TMA: NOT DETECTED
Trichomonas vaginalis RNA: NOT DETECTED

## 2022-01-05 ENCOUNTER — Encounter: Payer: Self-pay | Admitting: Radiology

## 2022-01-05 ENCOUNTER — Ambulatory Visit: Payer: BC Managed Care – PPO | Admitting: Radiology

## 2022-01-05 VITALS — BP 114/72

## 2022-01-05 DIAGNOSIS — Z30431 Encounter for routine checking of intrauterine contraceptive device: Secondary | ICD-10-CM | POA: Diagnosis not present

## 2022-01-05 NOTE — Progress Notes (Signed)
     History:  42 y.o. G1P0101 here today for today for IUD string check; Mirena IUD was placed  11/11/21. No complaints about the IUD, no concerning side effects.  The following portions of the patient's history were reviewed and updated as appropriate: allergies, current medications, past family history, past medical history, past social history, past surgical history and problem list.  Review of Systems:  Pertinent items are noted in HPI.   Objective:  Physical Exam Blood pressure 114/72. Gen: NAD Abd: Soft, nontender and nondistended Pelvic: Normal appearing external genitalia; normal appearing vaginal mucosa and cervix.  IUD strings visualized, about 3 cm in length outside cervix.  Chaperone offered and declined.  Assessment & Plan:  Normal IUD check. Patient may keep IUD in place for up to 8 years. May remover sooner  if she desires pregnancy, or has side effects within that time.   Rubbie Battiest, [facility]
